# Patient Record
Sex: Male | Born: 1970 | Race: Black or African American | Hispanic: No | Marital: Single | Smoking: Current every day smoker
Health system: Southern US, Community
[De-identification: ages and names within clinical notes are randomized; demographics above are authoritative.]

## PROBLEM LIST (undated history)

## (undated) DIAGNOSIS — F1510 Other stimulant abuse, uncomplicated: Secondary | ICD-10-CM

## (undated) DIAGNOSIS — Z720 Tobacco use: Secondary | ICD-10-CM

## (undated) DIAGNOSIS — F1990 Other psychoactive substance use, unspecified, uncomplicated: Secondary | ICD-10-CM

## (undated) DIAGNOSIS — E119 Type 2 diabetes mellitus without complications: Secondary | ICD-10-CM

---

## 2010-07-24 ENCOUNTER — Emergency Department (HOSPITAL_COMMUNITY): Admission: EM | Admit: 2010-07-24 | Discharge: 2010-07-24 | Payer: Self-pay | Admitting: Emergency Medicine

## 2010-09-10 ENCOUNTER — Emergency Department (HOSPITAL_BASED_OUTPATIENT_CLINIC_OR_DEPARTMENT_OTHER)
Admission: EM | Admit: 2010-09-10 | Discharge: 2010-09-10 | Payer: Self-pay | Source: Home / Self Care | Admitting: Emergency Medicine

## 2010-11-03 ENCOUNTER — Emergency Department (HOSPITAL_BASED_OUTPATIENT_CLINIC_OR_DEPARTMENT_OTHER)
Admission: EM | Admit: 2010-11-03 | Discharge: 2010-11-03 | Disposition: A | Discharge status: Home / Self Care | Payer: Medicaid Other | Attending: Emergency Medicine | Admitting: Emergency Medicine

## 2010-11-03 DIAGNOSIS — E1169 Type 2 diabetes mellitus with other specified complication: Secondary | ICD-10-CM | POA: Insufficient documentation

## 2010-11-03 DIAGNOSIS — R197 Diarrhea, unspecified: Secondary | ICD-10-CM | POA: Insufficient documentation

## 2010-11-03 LAB — URINE MICROSCOPIC-ADD ON

## 2010-11-03 LAB — URINALYSIS, ROUTINE W REFLEX MICROSCOPIC
Ketones, ur: NEGATIVE mg/dL
Leukocytes, UA: NEGATIVE
Nitrite: NEGATIVE
Specific Gravity, Urine: 1.031 — ABNORMAL HIGH (ref 1.005–1.030)
Urine Glucose, Fasting: >1000 mg/dL — AB
pH: 7 (ref 5.0–8.0)

## 2010-11-03 LAB — GLUCOSE, CAPILLARY
Glucose-Capillary: 357 mg/dL — ABNORMAL HIGH (ref 70–99)
Glucose-Capillary: 321 mg/dL — ABNORMAL HIGH (ref 70–99)
Glucose-Capillary: 265 mg/dL — ABNORMAL HIGH (ref 70–99)

## 2010-11-03 LAB — BASIC METABOLIC PANEL
BUN: 15 mg/dL (ref 6–23)
Chloride: 101 mEq/L (ref 96–112)
GFR calc non Af Amer: >60 mL/min (ref >60)
Potassium: 4.5 mEq/L (ref 3.5–5.1)
Sodium: 141 mEq/L (ref 135–145)

## 2010-11-06 ENCOUNTER — Encounter (INDEPENDENT_AMBULATORY_CARE_PROVIDER_SITE_OTHER): Payer: Self-pay | Admitting: *Deleted

## 2010-11-14 NOTE — Letter (Signed)
Summary: New Patient letter  [facility]  [address] [place], [place] [address]   Phone: [phone]  Fax: [phone]       11/06/2010 MRN: [id number]  [place] [address] [place], [place]  [address]  Dear Mr. PAUTSCH,  Welcome to the Gastroenterology Division at [facility].    You are scheduled to see Dr.  Leone Payor on 12-15-10 at 2:30P.M. on the 3rd floor at [facility], [address]  We ask that you try to arrive at our office 15 minutes prior to your appointment time to allow for check-in.  We would like you to complete the enclosed self-administered evaluation form prior to your visit and bring it with you on the day of your appointment.  We will review it with you.  Also, please bring a complete list of all your medications or, if you prefer, bring the medication bottles and we will list them.  Please bring your insurance card so that we may make a copy of it.  If your insurance requires a referral to see a specialist, please bring your referral form from your primary care physician.  Co-payments are due at the time of your visit and may be paid by cash, check or credit card.     Your office visit will consist of a consult with your physician (includes a physical exam), any laboratory testing he/she may order, scheduling of any necessary diagnostic testing (e.g. x-ray, ultrasound, CT-scan), and scheduling of a procedure (e.g. Endoscopy, Colonoscopy) if required.  Please allow enough time on your schedule to allow for any/all of these possibilities.    If you cannot keep your appointment, please call [phone] to cancel or reschedule prior to your appointment date.  This allows [place] the opportunity to schedule an appointment for another patient in need of care.  If you do not cancel or reschedule by 5 p.m. the business day prior to your appointment date, you will be charged a $50.00 late cancellation/no-show fee.    Thank you for choosing  [facility] for your medical needs.  We appreciate the opportunity to care for you.  Please visit [place] at our website  to learn more about our practice.                     Sincerely,                                                             The Gastroenterology Division

## 2010-11-27 LAB — COMPREHENSIVE METABOLIC PANEL
AST: 205 U/L — ABNORMAL HIGH (ref 0–37)
Albumin: 4.7 g/dL (ref 3.5–5.2)
BUN: 16 mg/dL (ref 6–23)
Calcium: 9.4 mg/dL (ref 8.4–10.5)
Chloride: 102 mEq/L (ref 96–112)
Creatinine, Ser: 0.8 mg/dL (ref 0.4–1.5)
GFR calc Af Amer: >60 mL/min (ref >60)
Total Bilirubin: 0.9 mg/dL (ref 0.3–1.2)
Total Protein: 7.9 g/dL (ref 6.0–8.3)

## 2010-11-27 LAB — GLUCOSE, CAPILLARY: Glucose-Capillary: 274 mg/dL — ABNORMAL HIGH (ref 70–99)

## 2010-11-27 LAB — DIFFERENTIAL
Basophils Absolute: 0 10*3/uL (ref 0.0–0.1)
Lymphocytes Relative: 26 % (ref 12–46)
Lymphs Abs: 2.3 10*3/uL (ref 0.7–4.0)
Monocytes Absolute: 0.8 10*3/uL (ref 0.1–1.0)
Neutro Abs: 5.6 10*3/uL (ref 1.7–7.7)

## 2010-11-27 LAB — CBC
MCH: 27.8 pg (ref 26.0–34.0)
MCHC: 34.5 g/dL (ref 30.0–36.0)
MCV: 80.5 fL (ref 78.0–100.0)
Platelets: 199 10*3/uL (ref 150–400)
RDW: 13.5 % (ref 11.5–15.5)

## 2010-11-28 LAB — HEMOGLOBIN A1C: Hgb A1c MFr Bld: 13.1 % — ABNORMAL HIGH (ref <5.7)

## 2010-11-28 LAB — AST: AST: 20 U/L (ref 0–37)

## 2010-11-28 LAB — ALT: ALT: 24 U/L (ref 0–53)

## 2010-12-15 ENCOUNTER — Ambulatory Visit: Payer: Medicaid Other | Admitting: Internal Medicine

## 2012-11-18 ENCOUNTER — Encounter (HOSPITAL_COMMUNITY): Payer: Self-pay | Admitting: *Deleted

## 2012-11-18 ENCOUNTER — Emergency Department (HOSPITAL_COMMUNITY)
Admission: EM | Admit: 2012-11-18 | Discharge: 2012-11-18 | Disposition: A | Discharge status: Home / Self Care | Payer: Medicaid Other | Source: Home / Self Care | Attending: Emergency Medicine | Admitting: Emergency Medicine

## 2012-11-18 DIAGNOSIS — E119 Type 2 diabetes mellitus without complications: Secondary | ICD-10-CM

## 2012-11-18 HISTORY — DX: Type 2 diabetes mellitus without complications: E11.9

## 2012-11-18 MED ORDER — FREESTYLE SYSTEM KIT: 1.0000 | PACK | Freq: Three times a day (TID) | Status: DC

## 2012-11-18 MED ORDER — METFORMIN HCL 1000 MG PO TABS: 1000.0000 mg | ORAL_TABLET | Freq: Two times a day (BID) | ORAL | Status: DC

## 2012-11-18 NOTE — ED Provider Notes (Signed)
History     CSN: [id number]  Arrival date & time 11/18/12  1042   First MD Initiated Contact with Patient 11/18/12 1126      Chief Complaint  Patient presents with  . Diabetes  . Medication Refill    (Consider location/radiation/quality/duration/timing/severity/associated sxs/prior treatment) HPI Comments: Pt recently released from jail.   Pt out of diabetes medication.  Pt also needs a glucometer.  Pt denies any complaints.  No current MD.   Past Medical History  Diagnosis Date  . Diabetes mellitus without complication     History reviewed. No pertinent past surgical history.  Family History  Problem Relation Age of Onset  . Diabetes Other     History  Substance Use Topics  . Smoking status: Never Smoker   . Smokeless tobacco: Not on file  . Alcohol Use: No      Review of Systems  All other systems reviewed and are negative.    Allergies  Review of patient's allergies indicates no known allergies.  Home Medications  No current outpatient prescriptions on file.  BP 111/67  Pulse 84  Temp(Src) 98.4 F (36.9 C) (Oral)  Resp 16  SpO2 99%  Physical Exam  Nursing note and vitals reviewed. Constitutional: He appears well-developed and well-nourished.  HENT:  Head: Normocephalic and atraumatic.  Eyes: Pupils are equal, round, and reactive to light.  Neck: Normal range of motion.  Cardiovascular: Normal rate and regular rhythm.   Pulmonary/Chest: Effort normal and breath sounds normal.  Abdominal: Soft.  Musculoskeletal: Normal range of motion.  Neurological: He is alert.  Skin: Skin is warm.    ED Course  Procedures (including critical care time)  Labs Reviewed - No data to display No results found.   1. Diabetes mellitus       MDM  Metformin 1000 bid        Lonia Skinner [place], [place] 11/18/12 1141  Lonia Skinner [place], [place] 11/18/12 1143

## 2012-11-18 NOTE — ED Provider Notes (Signed)
Medical screening examination/treatment/procedure(s) were performed by non-physician practitioner and as supervising physician I was immediately available for consultation/collaboration.  David Keller, M.D.  David C Keller, MD 11/18/12 2138 

## 2012-11-18 NOTE — ED Notes (Signed)
Pt reports needing med refill for diabetic meds and glucometer  - recently acquired Medicaid after being incarcerated  - unable to test glucose , no meter - also [facility]

## 2012-12-05 ENCOUNTER — Emergency Department (HOSPITAL_COMMUNITY)
Admission: EM | Admit: 2012-12-05 | Discharge: 2012-12-05 | Disposition: A | Discharge status: Home / Self Care | Payer: Medicaid Other | Source: Home / Self Care

## 2012-12-29 ENCOUNTER — Encounter (HOSPITAL_COMMUNITY): Payer: Self-pay | Admitting: Emergency Medicine

## 2012-12-29 ENCOUNTER — Emergency Department (HOSPITAL_COMMUNITY)
Admission: EM | Admit: 2012-12-29 | Discharge: 2012-12-29 | Disposition: A | Discharge status: Home / Self Care | Payer: Medicaid Other | Source: Home / Self Care | Attending: Emergency Medicine | Admitting: Emergency Medicine

## 2012-12-29 DIAGNOSIS — J020 Streptococcal pharyngitis: Secondary | ICD-10-CM

## 2012-12-29 LAB — POCT RAPID STREP A: Streptococcus, Group A Screen (Direct): POSITIVE — AB

## 2012-12-29 MED ORDER — ACETAMINOPHEN-CODEINE #3 300-30 MG PO TABS: 1.0000 | ORAL_TABLET | Freq: Four times a day (QID) | ORAL | Status: DC | PRN

## 2012-12-29 MED ORDER — AZITHROMYCIN 250 MG PO TABS: 250.0000 mg | ORAL_TABLET | Freq: Every day | ORAL | Status: DC

## 2012-12-29 NOTE — ED Notes (Signed)
Reports waking today with sore throat, cough, sore in chest, headache, and generally feeling bad

## 2012-12-29 NOTE — ED Provider Notes (Signed)
History     CSN: [id number]  Arrival date & time 12/29/12  1515   First MD Initiated Contact with Patient 12/29/12 1519      Chief Complaint  Patient presents with  . Sore Throat    (Consider location/radiation/quality/duration/timing/severity/associated sxs/prior treatment) HPI Comments: Patient presents urgent care this evening complaining that for several days been feeling somewhat tired having a sore throat cough headache and generally feeling malaise. Has had 3 family members and also with similar symptoms last week he also described that couple days ago he had 2 episodes of liquidy diarrhea. Patient denies any abdominal pain, shortness of breath nausea or vomiting but does feel discomfort when he swallows.  Patient is a 42 y.o. male presenting with pharyngitis. The history is provided by the patient.  Sore Throat This is a new problem. The current episode started more than 1 week ago. The problem occurs constantly. The problem has been gradually worsening. Pertinent negatives include no abdominal pain, no headaches and no shortness of breath. The symptoms are aggravated by swallowing. Nothing relieves the symptoms. He has tried nothing for the symptoms.    Past Medical History  Diagnosis Date  . Diabetes mellitus without complication     History reviewed. No pertinent past surgical history.  Family History  Problem Relation Age of Onset  . Diabetes Other     History  Substance Use Topics  . Smoking status: Never Smoker   . Smokeless tobacco: Not on file  . Alcohol Use: No      Review of Systems  Constitutional: Positive for appetite change. Negative for fever, diaphoresis, fatigue and unexpected weight change.  HENT: Positive for congestion and sore throat. Negative for ear pain, rhinorrhea, trouble swallowing, neck pain, neck stiffness, voice change and postnasal drip.   Respiratory: Negative for cough and shortness of breath.   Cardiovascular: Negative for leg  swelling.  Gastrointestinal: Negative for abdominal pain.  Endocrine: Negative for polyuria.  Skin: Negative for rash.  Neurological: Negative for headaches.  Hematological: Negative for adenopathy.    Allergies  Penicillins  Home Medications   Current Outpatient Rx  Name  Route  Sig  Dispense  Refill  . acetaminophen-codeine (TYLENOL #3) 300-30 MG per tablet   Oral   Take 1-2 tablets by mouth every 6 (six) hours as needed for pain.   15 tablet   0   . azithromycin (ZITHROMAX) 250 MG tablet   Oral   Take 1 tablet (250 mg total) by mouth daily. Take first 2 tablets together, then 1 every day until finished.   6 tablet   0   . glucose monitoring kit (FREESTYLE) monitoring kit   Does not apply   1 each by Does not apply route 3 (three) times daily. Please dispense glucose monitor, strips and supplies.   1 each   prn   . metFORMIN (GLUCOPHAGE) 1000 MG tablet   Oral   Take 1 tablet (1,000 mg total) by mouth 2 (two) times daily.   60 tablet   3     BP 133/77  Pulse 99  Temp(Src) 100 F (37.8 C) (Oral)  Resp 16  SpO2 97%  Physical Exam  Vitals reviewed. Constitutional: Vital signs are normal. He appears well-developed and well-nourished.  Non-toxic appearance. He does not have a sickly appearance. He does not appear ill. No distress.  HENT:  Head: Normocephalic.  Right Ear: Tympanic membrane normal.  Left Ear: Tympanic membrane normal.  Mouth/Throat: Uvula is midline. Oropharyngeal exudate  and posterior oropharyngeal erythema present. No posterior oropharyngeal edema or tonsillar abscesses.  Eyes: Conjunctivae are normal. Right eye exhibits no discharge. Left eye exhibits no discharge. No scleral icterus.  Neck: No thyromegaly present.  Cardiovascular: Normal rate.  Exam reveals no gallop and no friction rub.   No murmur heard. Pulmonary/Chest: Effort normal and breath sounds normal. He has no decreased breath sounds. He has no wheezes. He has no rhonchi. He has  no rales.  Abdominal: There is no tenderness.  Musculoskeletal: He exhibits no tenderness.  Neurological: He is alert.  Skin: No rash noted. No erythema.    ED Course  Procedures (including critical care time)  Labs Reviewed  POCT RAPID STREP A ([facility] ONLY) - Abnormal; Notable for the following:    Streptococcus, Group A Screen (Direct) POSITIVE (*)    All other components within normal limits   No results found.   1. Pharyngitis, streptococcal       MDM  Uncomplicated streptococcal pharyngitis. Patient has been prescribed a macrolide course of 5 days. Given a prescription of Tylenol #3.       Jimmie Molly, MD 12/29/12 [id number]

## 2012-12-29 NOTE — ED Notes (Signed)
cbg result had not crossed over to record.  Lab reported cbg as 213.  Reported to dr Ladon Applebaum

## 2013-01-28 ENCOUNTER — Ambulatory Visit: Payer: Medicaid Other | Attending: Family Medicine | Admitting: Family Medicine

## 2013-01-28 VITALS — BP 124/75 | HR 85 | Temp 98.9°F | Ht 68.5 in | Wt 218.0 lb

## 2013-01-28 DIAGNOSIS — N529 Male erectile dysfunction, unspecified: Secondary | ICD-10-CM

## 2013-01-28 DIAGNOSIS — E1169 Type 2 diabetes mellitus with other specified complication: Secondary | ICD-10-CM

## 2013-01-28 MED ORDER — METFORMIN HCL ER 500 MG PO TB24: 1000.0000 mg | ORAL_TABLET | Freq: Two times a day (BID) | ORAL | Status: DC

## 2013-01-28 MED ORDER — TADALAFIL 10 MG PO TABS: 10.0000 mg | ORAL_TABLET | ORAL | Status: DC | PRN

## 2013-01-28 NOTE — Patient Instructions (Addendum)
Erectile Dysfunction Erectile dysfunction (ED) is the inability to get a good enough erection to have sexual intercourse. ED may involve:  Inability to get an erection.  Lack of enough hardness to allow penetration.  Loss of the erection before sex is finished.  Premature ejaculation.  Any combination of these problems if they occur more than 25% of the time. CAUSES  Certain drugs, such as:  Pain relievers.  Antihistamines.  Antidepressants.  Blood pressure medicines.  Water pills.  Ulcer medicines.  Muscle relaxants.  Illegal drugs.  Excessive drinking.  Psychological causes, such as:  Anxiety.  Depression.  Sadness.  Exhaustion.  Performance fear.  Stress.  Physical causes, such as:  Artery problems. This may include diabetes, smoking, liver disease, or atherosclerosis.  High blood pressure.  Hormonal problems, such as low testosterone.  Obesity.  Nerve problems. This may include back or pelvic injuries, diabetes, multiple sclerosis, Parkinson's disease, or some surgeries. SYMPTOMS  Inability to get an erection.  Lack of enough hardness to allow penetration.  Loss of the erection before sex is finished.  Premature ejaculation.  Normal erections at some times, but with frequent unsatisfactory episodes.  Orgasms that are not satisfactory in sensation or frequency.  Low sexual satisfaction in either partner because of erection problems.  A curved penis occurring with erection. The curve may cause pain or may be too curved to allow for intercourse.  Never having nighttime erections. DIAGNOSIS Your caregiver can often diagnose this condition by:  Performing a physical exam to find other diseases or specific problems with the penis.  Asking you detailed questions about the problem.  Performing blood tests to check for diabetes or to measure hormone levels.  Performing urine tests to find other underlying health  conditions.  Performing an ultrasound to check for scarring.  Performing a test to check blood flow to the penis.  Doing a sleep study at home to measure nighttime erections. TREATMENT   You may be prescribed medicines by mouth.  You may be given medicine injections into the penis.  You may be prescribed a vacuum pump with a ring.  Penile implant surgery may be performed. You may receive:  An inflatable implant.  A semi-rigid implant.  Blood vessel surgery may be performed. HOME CARE INSTRUCTIONS  Take all medicine as directed by your caregiver. Do not take any other medicines without talking to your caregiver first.  Follow your caregiver's directions for specific treatments as prescribed.  Follow up with your caregiver as directed. Document Released: 08/31/2000 Document Revised: 11/26/2011 Document Reviewed: 12/24/2010 [facility] Patient Information 2013 [place], [place]. 1800 Calorie Diet for Diabetes Meal Planning The 1800 calorie diet is designed for eating up to 1800 calories each day. Following this diet and making healthy meal choices can help improve overall health. This diet controls blood sugar (glucose) levels and can also help lower blood pressure and cholesterol. SERVING SIZES Measuring foods and serving sizes helps to make sure you are getting the right amount of food. The list below tells how big or small some common serving sizes are:  1 oz.........4 stacked dice.  3 oz........Marland KitchenDeck of cards.  1 tsp.......Marland KitchenTip of little finger.  1 tbs......Marland KitchenMarland KitchenThumb.  2 tbs.......Marland KitchenGolf ball.   cup......Marland KitchenHalf of a fist.  1 cup.......Marland KitchenA fist. GUIDELINES FOR CHOOSING FOODS The goal of this diet is to eat a variety of foods and limit calories to 1800 each day. This can be done by choosing foods that are low in calories and fat. The diet also suggests  eating small amounts of food frequently. Doing this helps control your blood glucose levels so they do not get too high or too  low. Each meal or snack may include a protein food source to help you feel more satisfied and to stabilize your blood glucose. Try to eat about the same amount of food around the same time each day. This includes weekend days, travel days, and days off work. Space your meals about 4 to 5 hours apart and add a snack between them if you wish.  For example, a daily food plan could include breakfast, a morning snack, lunch, dinner, and an evening snack. Healthy meals and snacks include whole grains, vegetables, fruits, lean meats, poultry, fish, and dairy products. As you plan your meals, select a variety of foods. Choose from the bread and starch, vegetable, fruit, dairy, and meat/protein groups. Examples of foods from each group and their suggested serving sizes are listed below. Use measuring cups and spoons to become familiar with what a healthy portion looks like. Bread and Starch Each serving equals 15 grams of carbohydrates.  1 slice bread.   bagel.   cup cold cereal (unsweetened).   cup hot cereal or mashed potatoes.  1 small potato (size of a computer mouse).   cup cooked pasta or rice.   English muffin.  1 cup broth-based soup.  3 cups of popcorn.  4 to 6 whole-wheat crackers.   cup cooked beans, peas, or corn. Vegetable Each serving equals 5 grams of carbohydrates.   cup cooked vegetables.  1 cup raw vegetables.   cup tomato or vegetable juice. Fruit Each serving equals 15 grams of carbohydrates.  1 small apple or orange.  1 cup watermelon or strawberries.   cup applesauce (no sugar added).  2 tbs raisins.   banana.   cup canned fruit, packed in water, its own juice, or sweetened with a sugar substitute.   cup unsweetened fruit juice. Dairy Each serving equals 12 to 15 grams of carbohydrates.  1 cup fat-free milk.  6 oz artificially sweetened yogurt or plain yogurt.  1 cup low-fat buttermilk.  1 cup soy milk.  1 cup almond  milk. Meat/Protein  1 large egg.  2 to 3 oz meat, poultry, or fish.   cup low-fat cottage cheese.  1 tbs peanut butter.  1 oz low-fat cheese.   cup tuna in water.   cup tofu. Fat  1 tsp oil.  1 tsp trans-fat-free margarine.  1 tsp butter.  1 tsp mayonnaise.  2 tbs avocado.  1 tbs salad dressing.  1 tbs cream cheese.  2 tbs sour cream. SAMPLE 1800 CALORIE DIET PLAN Breakfast   cup unsweetened cereal (1 carb serving).  1 cup fat-free milk (1 carb serving).  1 slice whole-wheat toast (1 carb serving).   small banana (1 carb serving).  1 scrambled egg.  1 tsp trans-fat-free margarine. Lunch  Tuna sandwich.  2 slices whole-wheat bread (2 carb servings).   cup canned tuna in water, drained.  1 tbs reduced fat mayonnaise.  1 stalk celery, chopped.  2 slices tomato.  1 lettuce leaf.  1 cup carrot sticks.  24 to 30 seedless grapes (2 carb servings).  6 oz light yogurt (1 carb serving). Afternoon Snack  3 graham cracker squares (1 carb serving).  Fat-free milk, 1 cup (1 carb serving).  1 tbs peanut butter. Dinner  3 oz salmon, broiled with 1 tsp oil.  1 cup mashed potatoes (2 carb servings) with 1  tsp trans-fat-free margarine.  1 cup fresh or frozen green beans.  1 cup steamed asparagus.  1 cup fat-free milk (1 carb serving). Evening Snack  3 cups air-popped popcorn (1 carb serving).  2 tbs parmesan cheese sprinkled on top. MEAL PLAN Use this worksheet to help you make a daily meal plan based on the 1800 calorie diet suggestions. If you are using this plan to help you control your blood glucose, you may interchange carbohydrate-containing foods (dairy, starches, and fruits). Select a variety of fresh foods of varying colors and flavors. The total amount of carbohydrate in your meals or snacks is more important than making sure you include all of the food groups every time you eat. Choose from the following foods to build your  day's meals:  8 Starches.  4 Vegetables.  3 Fruits.  2 Dairy.  6 to 7 oz Meat/Protein.  Up to 4 Fats. Your dietician can use this worksheet to help you decide how many servings and which types of foods are right for you. BREAKFAST Food Group and Servings / Food Choice Starch ________________________________________________________ Dairy _________________________________________________________ Fruit _________________________________________________________ Meat/Protein __________________________________________________ Fat ___________________________________________________________ LUNCH Food Group and Servings / Food Choice Starch ________________________________________________________ Meat/Protein __________________________________________________ Vegetable _____________________________________________________ Fruit _________________________________________________________ Dairy _________________________________________________________ Fat ___________________________________________________________ Aura Fey Food Group and Servings / Food Choice Starch ________________________________________________________ Meat/Protein __________________________________________________ Fruit __________________________________________________________ Dairy _________________________________________________________ Laural Golden Food Group and Servings / Food Choice Starch _________________________________________________________ Meat/Protein ___________________________________________________ Dairy __________________________________________________________ Vegetable ______________________________________________________ Fruit ___________________________________________________________ Fat ____________________________________________________________ Lollie Sails Food Group and Servings / Food Choice Fruit __________________________________________________________ Meat/Protein  ___________________________________________________ Dairy __________________________________________________________ Starch _________________________________________________________ DAILY TOTALS Starch ____________________________ Vegetable _________________________ Fruit _____________________________ Dairy _____________________________ Meat/Protein______________________ Fat _______________________________ Document Released: 03/26/2005 Document Revised: 11/26/2011 Document Reviewed: 07/20/2011 [employer] Patient Information 2013 [place], [place]. A1c, Hemoglobin A1c The A1c (hemoglobin A1c, glycosylated hemoglobin) test checks the average amount of sugar (glucose) in the blood over the last 2 to 3 months. It does this by measuring the concentration of glycosylated hemoglobin. As glucose circulates in the blood, some of it binds to hemoglobin A. This is the main form of hemoglobin in adults. Hemoglobin is a red protein that carries oxygen in the red blood cells (RBCs). Once the glucose is bound to the hemoglobin A, it remains there for the life of the red blood cell (about 120 days). This combination of glucose and hemoglobin A is called A1c. Increased glucose in the blood, increases the hemoglobin A1c. A1c levels do not change quickly but will shift as RBCs are replaced. A1c is a valuable test because it enables you to know how your glucose has been controlled over the past 3 months.  5% A1c  Estimated Average Glucose mg/dL: 97  6% Z6X  Estimated Average Glucose mg/dL: 096  7% E4V  Estimated Average Glucose mg/dL: 409  8% W1X  Estimated Average Glucose mg/dL: 914  9% N8G  Estimated Average Glucose mg/dL: 956  21% H0Q  Estimated Average Glucose mg/dL: 657  84% O9G  Estimated Average Glucose mg/dL: 295  28% U1L  Estimated Average Glucose mg/dL: 244 The American Diabetes Association (ADA) recommends testing your A1c level 4 times each year if you have type 1 or type 2  diabetes and use insulin; or 2 times each year if you have type 2 diabetes and do not use insulin. When someone is first diagnosed with diabetes or if control is not good, A1c may be ordered more frequently. PREPARATION FOR TEST No preparation or fasting is necessary for this blood sample. NORMAL FINDINGS  Adults without diabetes: 2.2 to 4.8%  Children without diabetes: 1.8 to 4.0%  Good diabetic control: 2.5 to 5.9%  Fair diabetic control: 6 to 8%  Poor diabetic control: greater than 8% The values of A1c may be falsely low in pregnancy, in disorders with shortened red blood cell lives, or in sickle cell disease or trait (carrier). The values may be falsely high in disorders with longer red cell lives, or in people with Thallassemia, kidney failure, or iron deficiency anemia. Ranges for normal findings may vary among different laboratories and hospitals. You should always check with your doctor after having lab work or other tests done to discuss the meaning of your test results and whether your values are considered within normal limits. MEANING OF TEST  Your caregiver will go over the test results with you and discuss the importance and meaning of your results, as well as treatment options and the need for additional tests if necessary. If your A1c is greater than 7%, discuss treatment options with your caregiver. OBTAINING THE TEST RESULTS  It is your responsibility to obtain your test results. Ask the lab or department performing the test when and how you will get your results. Document Released: 09/25/2004 Document Revised: 11/26/2011 Document Reviewed: 08/07/2008 [facility] Patient Information 2013 [place], [place]. Hypoglycemia (Low Blood Sugar) Hypoglycemia is when the glucose (sugar) in your blood is too low. Hypoglycemia can happen for many reasons. It can happen to people with or without diabetes. Hypoglycemia can develop quickly and can be a medical emergency.  CAUSES  Having  hypoglycemia does not mean that you will develop diabetes. Different causes include:  Missed or delayed meals or not enough carbohydrates eaten.  Medication overdose. This could be by accident or deliberate. If by accident, your medication may need to be adjusted or changed.  Exercise or increased activity without adjustments in carbohydrates or medications.  A nerve disorder that affects body functions like your heart rate, blood pressure and digestion (autonomic neuropathy).  A condition where the stomach muscles do not function properly (gastroparesis). Therefore, medications may not absorb properly.  The inability to recognize the signs of hypoglycemia (hypoglycemic unawareness).  Absorption of insulin  may be altered.  Alcohol consumption.  Pregnancy/menstrual cycles/postpartum. This may be due to hormones.  Certain kinds of tumors. This is very rare. SYMPTOMS   Sweating.  Hunger.  Dizziness.  Blurred vision.  Drowsiness.  Weakness.  Headache.  Rapid heart beat.  Shakiness.  Nervousness. DIAGNOSIS  Diagnosis is made by monitoring blood glucose in one or all of the following ways:  Fingerstick blood glucose monitoring.  Laboratory results. TREATMENT  If you think your blood glucose is low:  Check your blood glucose, if possible. If it is less than 70 mg/dl, take one of the following:  3-4 glucose tablets.   cup juice (prefer clear like apple).   cup "regular" soda pop.  1 cup milk.  -1 tube of glucose gel.  5-6 hard candies.  Do not over treat because your blood glucose (sugar) will only go too high.  Wait 15 minutes and recheck your blood glucose. If it is still less than 70 mg/dl (or below your target range), repeat treatment.  Eat a snack if it is more than one hour until your next meal. Sometimes, your blood glucose may go so low that you are unable to treat yourself. You may need someone to help you. You may even pass out or be unable  to swallow. This may require you  to get an injection of glucagon, which raises the blood glucose. HOME CARE INSTRUCTIONS  Check blood glucose as recommended by your caregiver.  Take medication as prescribed by your caregiver.  Follow your meal plan. Do not skip meals. Eat on time.  If you are going to drink alcohol, drink it only with meals.  Check your blood glucose before driving.  Check your blood glucose before and after exercise. If you exercise longer or different than usual, be sure to check blood glucose more frequently.  Always carry treatment with you. Glucose tablets are the easiest to carry.  Always wear medical alert jewelry or carry some form of identification that states that you have diabetes. This will alert people that you have diabetes. If you have hypoglycemia, they will have a better idea on what to do. SEEK MEDICAL CARE IF:   You are having problems keeping your blood sugar at target range.  You are having frequent episodes of hypoglycemia.  You feel you might be having side effects from your medicines.  You have symptoms of an illness that is not improving after 3-4 days.  You notice a change in vision or a new problem with your vision. SEEK IMMEDIATE MEDICAL CARE IF:   You are a family member or friend of a person whose blood glucose goes below 70 mg/dl and is accompanied by:  Confusion.  A change in mental status.  The inability to swallow.  Passing out. Document Released: 09/03/2005 Document Revised: 11/26/2011 Document Reviewed: 12/31/2011 [facility] Patient Information 2013 [place], [place]. Diabetes and Exercise Regular exercise is important and can help:   Control blood glucose (sugar).  Decrease blood pressure.    Control blood lipids (cholesterol, triglycerides).  Improve overall health. BENEFITS FROM EXERCISE  Improved fitness.  Improved flexibility.  Improved endurance.  Increased bone density.  Weight  control.  Increased muscle strength.  Decreased body fat.  Improvement of the body's use of insulin, a hormone.  Increased insulin sensitivity.  Reduction of insulin needs.  Reduced stress and tension.  Helps you feel better. People with diabetes who add exercise to their lifestyle gain additional benefits, including:  Weight loss.  Reduced appetite.  Improvement of the body's use of blood glucose.  Decreased risk factors for heart disease:  Lowering of cholesterol and triglycerides.  Raising the level of good cholesterol (high-density lipoproteins, HDL).  Lowering blood sugar.  Decreased blood pressure. TYPE 1 DIABETES AND EXERCISE  Exercise will usually lower your blood glucose.  If blood glucose is greater than 240 mg/dl, check urine ketones. If ketones are present, do not exercise.  Location of the insulin injection sites may need to be adjusted with exercise. Avoid injecting insulin into areas of the body that will be exercised. For example, avoid injecting insulin into:  The arms when playing tennis.  The legs when jogging. For more information, discuss this with your caregiver.  Keep a record of:  Food intake.  Type and amount of exercise.  Expected peak times of insulin action.  Blood glucose levels. Do this before, during, and after exercise. Review your records with your caregiver. This will help you to develop guidelines for adjusting food intake and insulin amounts.  TYPE 2 DIABETES AND EXERCISE  Regular physical activity can help control blood glucose.  Exercise is important because it may:  Increase the body's sensitivity to insulin.  Improve blood glucose control.  Exercise reduces the risk of heart disease. It decreases serum cholesterol and triglycerides. It also lowers blood  pressure.  Those who take insulin or oral hypoglycemic agents should watch for signs of hypoglycemia. These signs include dizziness, shaking, sweating, chills,  and confusion.  Body water is lost during exercise. It must be replaced. This will help to avoid loss of body fluids (dehydration) or heat stroke. Be sure to talk to your caregiver before starting an exercise program to make sure it is safe for you. Remember, any activity is better than none.  Document Released: 11/24/2003 Document Revised: 11/26/2011 Document Reviewed: 03/10/2009 [facility] Patient Information 2013 [place], [place].

## 2013-01-29 LAB — HEMOGLOBIN A1C: Mean Plasma Glucose: 180 mg/dL — ABNORMAL HIGH (ref <117)

## 2013-01-29 LAB — COMPREHENSIVE METABOLIC PANEL
Albumin: 4.9 g/dL (ref 3.5–5.2)
BUN: 13 mg/dL (ref 6–23)
Calcium: 9.5 mg/dL (ref 8.4–10.5)
Chloride: 106 mEq/L (ref 96–112)
Creat: 0.86 mg/dL (ref 0.50–1.35)
Glucose, Bld: 123 mg/dL — ABNORMAL HIGH (ref 70–99)
Potassium: 4.1 mEq/L (ref 3.5–5.3)

## 2013-01-29 LAB — LIPID PANEL: Cholesterol: 116 mg/dL (ref 0–200)

## 2013-01-29 NOTE — Progress Notes (Signed)
Patient ID: Dan [name], male   DOB: 1971-09-13, 42 y.o.   MRN: [id number] CC: diabetes check up   HPI: Pt says that he has been working out more frequently.  He needs refills of his meds.  He says that he has no low BS.  Pt says he wants a prescription for cialis.  He has taken it in the past.  He has no other complaints.   Allergies  Allergen Reactions  . Penicillins    Past Medical History  Diagnosis Date  . Diabetes mellitus without complication    Current Outpatient Prescriptions on File Prior to Visit  Medication Sig Dispense Refill  . acetaminophen-codeine (TYLENOL #3) 300-30 MG per tablet Take 1-2 tablets by mouth every 6 (six) hours as needed for pain.  15 tablet  0  . azithromycin (ZITHROMAX) 250 MG tablet Take 1 tablet (250 mg total) by mouth daily. Take first 2 tablets together, then 1 every day until finished.  6 tablet  0  . glucose monitoring kit (FREESTYLE) monitoring kit 1 each by Does not apply route 3 (three) times daily. Please dispense glucose monitor, strips and supplies.  1 each  prn   No current facility-administered medications on file prior to visit.   Family History  Problem Relation Age of Onset  . Diabetes Other   . Diabetes Maternal Grandfather    History   Social History  . Marital Status: Single    Spouse Name: N/A    Number of Children: N/A  . Years of Education: N/A   Occupational History  . Not on file.   Social History Main Topics  . Smoking status: Former Smoker    Quit date: 07/18/2012  . Smokeless tobacco: Not on file  . Alcohol Use: No  . Drug Use: No  . Sexually Active: Not on file   Other Topics Concern  . Not on file   Social History Narrative  . No narrative on file    Review of Systems  Constitutional: Negative for fever, chills, diaphoresis, activity change, appetite change and fatigue.  HENT: Negative for ear pain, nosebleeds, congestion, facial swelling, rhinorrhea, neck pain, neck stiffness and ear discharge.    Eyes: Negative for pain, discharge, redness, itching and visual disturbance.  Respiratory: Negative for cough, choking, chest tightness, shortness of breath, wheezing and stridor.   Cardiovascular: Negative for chest pain, palpitations and leg swelling.  Gastrointestinal: Negative for abdominal distention.  Genitourinary: Negative for dysuria, urgency, frequency, hematuria, flank pain, decreased urine volume, difficulty urinating and dyspareunia.  Musculoskeletal: Negative for back pain, joint swelling, arthralgias and gait problem.  Neurological: Negative for dizziness, tremors, seizures, syncope, facial asymmetry, speech difficulty, weakness, light-headedness, numbness and headaches.  Hematological: Negative for adenopathy. Does not bruise/bleed easily.  Psychiatric/Behavioral: Negative for hallucinations, behavioral problems, confusion, dysphoric mood, decreased concentration and agitation.    Objective:   Filed Vitals:   01/28/13 1847  BP: 124/75  Pulse: 85  Temp: 98.9 F (37.2 C)    Physical Exam  Constitutional: Appears well-developed and well-nourished. No distress.  HENT: Normocephalic. External right and left ear normal. Oropharynx is clear and moist.  Eyes: Conjunctivae and EOM are normal. PERRLA, no scleral icterus.  Neck: Normal ROM. Neck supple. No JVD. No tracheal deviation. No thyromegaly.  CVS: RRR, S1/S2 +, no murmurs, no gallops, no carotid bruit.  Pulmonary: Effort and breath sounds normal, no stridor, rhonchi, wheezes, rales.  Abdominal: Soft. BS +,  no distension, tenderness, rebound or guarding.  Musculoskeletal:  Normal range of motion. No edema and no tenderness.  Lymphadenopathy: No lymphadenopathy noted, cervical, inguinal. Neuro: Alert. Normal reflexes, muscle tone coordination. No cranial nerve deficit. Skin: Skin is warm and dry. No rash noted. Not diaphoretic. No erythema. No pallor.  Psychiatric: Normal mood and affect. Behavior, judgment, thought  content normal.   Lab Results  Component Value Date   WBC 8.7 09/10/2010   HGB 14.4 09/10/2010   HCT 41.7 09/10/2010   MCV 80.5 09/10/2010   PLT 199 09/10/2010   Lab Results  Component Value Date   CREATININE 0.86 01/28/2013   BUN 13 01/28/2013   NA 141 01/28/2013   K 4.1 01/28/2013   CL 106 01/28/2013   CO2 23 01/28/2013    Lab Results  Component Value Date   HGBA1C 7.9* 01/28/2013        Assessment:  Type 2 diabetes mellitus, uncontrolled Erectile Dysfunction        Plan:     Type II or unspecified type diabetes mellitus without mention of complication, uncontrolled - Plan: Comprehensive metabolic panel, Hemoglobin A1c, Lipid Panel, TSH, refilled metformin, Glucophage XR 1000 bid AC  Erectile dysfunction associated with type 2 diabetes mellitus Rx for cialis 10 mg prn, pt has used in the past   The patient was given clear instructions to go to ER or return to medical center if symptoms don't improve, worsen or new problems develop.  The patient verbalized understanding.  The patient was told to call to get lab results if they haven't heard anything in the next week.    Rodney Langton, MD, CDE, FAAFP [facility] [place], [place]

## 2013-01-29 NOTE — Progress Notes (Signed)
Quick Note:  Please inform patient that his labs show that his A1c is improving to 7.9% from 13%. Continue to exercise, watch diet and take medications. Other labs OK. Recheck labs in 3-4 months.   Rodney Langton, MD, CDE, FAAFP [facility] [place], [place]   ______

## 2013-01-30 ENCOUNTER — Telehealth: Payer: Self-pay

## 2013-01-30 NOTE — Telephone Encounter (Signed)
Patient returned call Lab results given 

## 2013-01-30 NOTE — Telephone Encounter (Signed)
Patient not available at either number Message left to return our call

## 2013-02-05 ENCOUNTER — Telehealth: Payer: Self-pay | Admitting: General Practice

## 2013-02-05 NOTE — Telephone Encounter (Signed)
02/05/13 Patient not available message left to tell patient that we returned the call. P.Sruti Ayllon,RN

## 2013-02-05 NOTE — Telephone Encounter (Signed)
Would like to know if there is a generic option for erectile dysfunction, Cialis is expensive.

## 2013-03-23 ENCOUNTER — Encounter (HOSPITAL_COMMUNITY): Payer: Self-pay | Admitting: Emergency Medicine

## 2013-03-23 ENCOUNTER — Emergency Department (HOSPITAL_COMMUNITY): Payer: Medicaid Other

## 2013-03-23 ENCOUNTER — Emergency Department (HOSPITAL_COMMUNITY)
Admission: EM | Admit: 2013-03-23 | Discharge: 2013-03-23 | Disposition: A | Discharge status: Home / Self Care | Payer: Medicaid Other | Attending: Emergency Medicine | Admitting: Emergency Medicine

## 2013-03-23 DIAGNOSIS — F17200 Nicotine dependence, unspecified, uncomplicated: Secondary | ICD-10-CM | POA: Insufficient documentation

## 2013-03-23 DIAGNOSIS — Z880 Allergy status to penicillin: Secondary | ICD-10-CM | POA: Insufficient documentation

## 2013-03-23 DIAGNOSIS — R0789 Other chest pain: Secondary | ICD-10-CM

## 2013-03-23 DIAGNOSIS — R071 Chest pain on breathing: Secondary | ICD-10-CM | POA: Insufficient documentation

## 2013-03-23 DIAGNOSIS — Z79899 Other long term (current) drug therapy: Secondary | ICD-10-CM | POA: Insufficient documentation

## 2013-03-23 DIAGNOSIS — E119 Type 2 diabetes mellitus without complications: Secondary | ICD-10-CM | POA: Insufficient documentation

## 2013-03-23 LAB — CBC
Hemoglobin: 14.1 g/dL (ref 13.0–17.0)
Platelets: 199 10*3/uL (ref 150–400)
RBC: 5.06 MIL/uL (ref 4.22–5.81)

## 2013-03-23 LAB — BASIC METABOLIC PANEL
CO2: 26 mEq/L (ref 19–32)
Calcium: 9.4 mg/dL (ref 8.4–10.5)
GFR calc non Af Amer: >90 mL/min (ref >90)
Glucose, Bld: 286 mg/dL — ABNORMAL HIGH (ref 70–99)
Potassium: 4.4 mEq/L (ref 3.5–5.1)
Sodium: 138 mEq/L (ref 135–145)

## 2013-03-23 LAB — POCT I-STAT TROPONIN I: Troponin i, poc: 0 ng/mL (ref 0.00–0.08)

## 2013-03-23 MED ORDER — METHOCARBAMOL 750 MG PO TABS: 750.0000 mg | ORAL_TABLET | Freq: Four times a day (QID) | ORAL | Status: DC

## 2013-03-23 NOTE — ED Notes (Signed)
Dr. Allen at bedside for evaluation

## 2013-03-23 NOTE — ED Notes (Signed)
Phlebotomy notified of need for workman's comp UDS

## 2013-03-23 NOTE — ED Notes (Signed)
Per EMS, patient works in [employer].  Patient was working and started having L chest pain.   Patient had shortness of breath and dizziness while having the chest pain.   Patient states he's always diaphoretic when working so he couldn't tell if he was additionally diaphoretic during pain.   Patient states he resolved by the time EMS got to his location and didn't want to come in.   Per EMS, patient refused IV line on the way in.  He advised EMS that he didn't need, since pain had resolved.   Patient states he feels fine now.   VSS.  CBG 265.

## 2013-03-23 NOTE — ED Provider Notes (Signed)
History    CSN: [id number] Arrival date & time 03/23/13  1020  First MD Initiated Contact with Patient 03/23/13 1041     Chief Complaint  Patient presents with  . Chest Pain   (Consider location/radiation/quality/duration/timing/severity/associated sxs/prior Treatment) Patient is a 42 y.o. male presenting with chest pain. The history is provided by the patient.  Chest Pain  patient here complaining of sudden onset of sharp left-sided rib pain that occurred while he was working outside shoveling sand. Pain is worse with certain movements. Denies any anginal type symptoms. No associated dyspnea diaphoresis. Pain is better when he remains still. These became anxious and EMS was called. Denies any shortness of breath. No treatment used prior to arrival Past Medical History  Diagnosis Date  . Diabetes mellitus without complication    History reviewed. No pertinent past surgical history. Family History  Problem Relation Age of Onset  . Diabetes Other   . Diabetes Maternal Grandfather    History  Substance Use Topics  . Smoking status: Current Every Day Smoker -- 0.50 packs/day    Types: Cigarettes    Last Attempt to Quit: 07/18/2012  . Smokeless tobacco: Not on file  . Alcohol Use: No    Review of Systems  Cardiovascular: Positive for chest pain.  All other systems reviewed and are negative.    Allergies  Penicillins  Home Medications   Current Outpatient Rx  Name  Route  Sig  Dispense  Refill  . glucose monitoring kit (FREESTYLE) monitoring kit   Does not apply   1 each by Does not apply route 3 (three) times daily. Please dispense glucose monitor, strips and supplies.   1 each   prn   . metFORMIN (GLUCOPHAGE XR) 500 MG 24 hr tablet   Oral   Take 2 tablets (1,000 mg total) by mouth 2 (two) times daily with a meal.   120 tablet   3    BP 125/70  Pulse 86  Temp(Src) 97.9 F (36.6 C) (Oral)  Resp 18  SpO2 97% Physical Exam  Nursing note and vitals  reviewed. Constitutional: He is oriented to person, place, and time. He appears well-developed and well-nourished.  Non-toxic appearance. No distress.  HENT:  Head: Normocephalic and atraumatic.  Eyes: Conjunctivae, EOM and lids are normal. Pupils are equal, round, and reactive to light.  Neck: Normal range of motion. Neck supple. No tracheal deviation present. No mass present.  Cardiovascular: Normal rate, regular rhythm and normal heart sounds.  Exam reveals no gallop.   No murmur heard. Pulmonary/Chest: Effort normal and breath sounds normal. No stridor. No respiratory distress. He has no decreased breath sounds. He has no wheezes. He has no rhonchi. He has no rales.    Abdominal: Soft. Normal appearance and bowel sounds are normal. He exhibits no distension. There is no tenderness. There is no rebound and no CVA tenderness.  Musculoskeletal: Normal range of motion. He exhibits no edema and no tenderness.  Neurological: He is alert and oriented to person, place, and time. He has normal strength. No cranial nerve deficit or sensory deficit. GCS eye subscore is 4. GCS verbal subscore is 5. GCS motor subscore is 6.  Skin: Skin is warm and dry. No abrasion and no rash noted.  Psychiatric: He has a normal mood and affect. His speech is normal and behavior is normal.    ED Course  Procedures (including critical care time) Labs Reviewed  CBC  BASIC METABOLIC PANEL   No results found.  No diagnosis found.  MDM   Date: 03/23/2013  Rate: 85  Rhythm: normal sinus rhythm  QRS Axis: normal  Intervals: normal  ST/T Wave abnormalities: normal  Conduction Disutrbances:none  Narrative Interpretation:   Old EKG Reviewed: none available  Patient with left-sided chest wall pain that appears to be musculoskeletal in nature. Doubt that this represents ACS. Will be placed him [place] relaxants and is stable for discharge. He also notes that he is not been compliant with his diabetes medications and will  begin to take his Glucophage twice a day  Toy Baker, MD 03/23/13 1340

## 2013-03-23 NOTE — ED Notes (Signed)
Pt not in room.

## 2013-03-26 ENCOUNTER — Telehealth (HOSPITAL_COMMUNITY): Payer: Self-pay | Admitting: Emergency Medicine

## 2013-03-26 NOTE — ED Notes (Signed)
Daniel w/Employee Health calling trying to find form for pt r/t chain of custody and registration form for Urine drug screen.  Unable to find in Epic chart or scanned documents

## 2015-01-16 ENCOUNTER — Encounter (HOSPITAL_COMMUNITY): Payer: Self-pay | Admitting: Emergency Medicine

## 2015-01-16 ENCOUNTER — Emergency Department (HOSPITAL_COMMUNITY): Payer: PRIVATE HEALTH INSURANCE

## 2015-01-16 ENCOUNTER — Emergency Department (HOSPITAL_COMMUNITY)
Admission: EM | Admit: 2015-01-16 | Discharge: 2015-01-16 | Disposition: A | Discharge status: Home / Self Care | Payer: PRIVATE HEALTH INSURANCE | Attending: Emergency Medicine | Admitting: Emergency Medicine

## 2015-01-16 DIAGNOSIS — Y998 Other external cause status: Secondary | ICD-10-CM | POA: Diagnosis not present

## 2015-01-16 DIAGNOSIS — E119 Type 2 diabetes mellitus without complications: Secondary | ICD-10-CM | POA: Diagnosis not present

## 2015-01-16 DIAGNOSIS — Z880 Allergy status to penicillin: Secondary | ICD-10-CM | POA: Insufficient documentation

## 2015-01-16 DIAGNOSIS — M25512 Pain in left shoulder: Secondary | ICD-10-CM

## 2015-01-16 DIAGNOSIS — Z79899 Other long term (current) drug therapy: Secondary | ICD-10-CM | POA: Insufficient documentation

## 2015-01-16 DIAGNOSIS — Y9389 Activity, other specified: Secondary | ICD-10-CM | POA: Insufficient documentation

## 2015-01-16 DIAGNOSIS — Z720 Tobacco use: Secondary | ICD-10-CM | POA: Diagnosis not present

## 2015-01-16 DIAGNOSIS — W182XXA Fall in (into) shower or empty bathtub, initial encounter: Secondary | ICD-10-CM | POA: Insufficient documentation

## 2015-01-16 DIAGNOSIS — Y9289 Other specified places as the place of occurrence of the external cause: Secondary | ICD-10-CM | POA: Diagnosis not present

## 2015-01-16 DIAGNOSIS — S4992XA Unspecified injury of left shoulder and upper arm, initial encounter: Secondary | ICD-10-CM | POA: Diagnosis not present

## 2015-01-16 MED ORDER — TRAMADOL HCL 50 MG PO TABS: 50.0000 mg | ORAL_TABLET | Freq: Four times a day (QID) | ORAL | Status: DC | PRN

## 2015-01-16 MED ORDER — MELOXICAM 7.5 MG PO TABS
ORAL_TABLET | ORAL | Status: AC
  Filled 2015-01-16: qty 1

## 2015-01-16 MED ORDER — MELOXICAM 7.5 MG PO TABS: 7.5000 mg | ORAL_TABLET | Freq: Every day | ORAL | Status: DC

## 2015-01-16 MED ORDER — TRAMADOL HCL 50 MG PO TABS
50.0000 mg | ORAL_TABLET | Freq: Once | ORAL | Status: AC
  Administered 2015-01-16: 50 mg via ORAL
  Filled 2015-01-16: qty 1

## 2015-01-16 NOTE — ED Notes (Signed)
Patient reports fell in shower today and tried to brace himself with his hand. Complaining of left shoulder pain radiating into neck and upper back.

## 2015-01-16 NOTE — ED Notes (Signed)
Spoke with [place], report given, stated to tell officer he can take him back to "his home camp".

## 2015-01-16 NOTE — Discharge Instructions (Signed)
Shoulder Pain The shoulder is the joint that connects your arms to your body. The bones that form the shoulder joint include the upper arm bone (humerus), the shoulder blade (scapula), and the collarbone (clavicle). The top of the humerus is shaped like a ball and fits into a rather flat socket on the scapula (glenoid cavity). A combination of muscles and strong, fibrous tissues that connect muscles to bones (tendons) support your shoulder joint and hold the ball in the socket. Small, fluid-filled sacs (bursae) are located in different areas of the joint. They act as cushions between the bones and the overlying soft tissues and help reduce friction between the gliding tendons and the bone as you move your arm. Your shoulder joint allows a wide range of motion in your arm. This range of motion allows you to do things like scratch your back or throw a ball. However, this range of motion also makes your shoulder more prone to pain from overuse and injury. Causes of shoulder pain can originate from both injury and overuse and usually can be grouped in the following four categories:  Redness, swelling, and pain (inflammation) of the tendon (tendinitis) or the bursae (bursitis).  Instability, such as a dislocation of the joint.  Inflammation of the joint (arthritis).  Broken bone (fracture). HOME CARE INSTRUCTIONS   Apply ice to the sore area.  Put ice in a plastic bag.  Place a towel between your skin and the bag.  Leave the ice on for 15-20 minutes, 3-4 times per day for the first 2 days, or as directed by your health care provider.  Stop using cold packs if they do not help with the pain.  If you have a shoulder sling or immobilizer, wear it as long as your caregiver instructs. Only remove it to shower or bathe. Move your arm as little as possible, but keep your hand moving to prevent swelling.  Squeeze a soft ball or foam pad as much as possible to help prevent swelling.  Only take  over-the-counter or prescription medicines for pain, discomfort, or fever as directed by your caregiver. SEEK MEDICAL CARE IF:   Your shoulder pain increases, or new pain develops in your arm, hand, or fingers.  Your hand or fingers become cold and numb.  Your pain is not relieved with medicines. SEEK IMMEDIATE MEDICAL CARE IF:   Your arm, hand, or fingers are numb or tingling.  Your arm, hand, or fingers are significantly swollen or turn white or blue. MAKE SURE YOU:   Understand these instructions.  Will watch your condition.  Will get help right away if you are not doing well or get worse. Document Released: 06/13/2005 Document Revised: 01/18/2014 Document Reviewed: 08/18/2011 [facility] Patient Information 2015 [place], [place]. This information is not intended to replace advice given to you by your health care provider. Make sure you discuss any questions you have with your health care provider.   Your xrays are negative for any acute injury tonight, although I suspect you may have a rotator cuff injury (see the attached sheet for further information about this injury). I recommend obtaining an MRI of your shoulder if your symptoms persist or do not improve using the sling and the medicines prescribed for the next week.  You should apply ice to your shoulder joint for 15 minutes 5-6 times daily for the next several days.

## 2015-01-19 NOTE — ED Provider Notes (Signed)
CSN: [id number]     Arrival date & time 01/16/15  1851 History   First MD Initiated Contact with Patient 01/16/15 2028     Chief Complaint  Patient presents with  . Shoulder Pain     (Consider location/radiation/quality/duration/timing/severity/associated sxs/prior Treatment) Patient is a 44 y.o. male presenting with shoulder pain. The history is provided by the patient.  Shoulder Pain Location:  Shoulder Injury: yes   Mechanism of injury: fall   Mechanism of injury comment:  Fell in shower today, hyperextended his left shoulder when he caught himself on the shower rail. Fall:    Fall occurred:  In the bathroom   Height of fall:  2 feet   Impact surface: did not hit the floor, caught with hands on the shower rail. Shoulder location:  L shoulder Pain details:    Quality:  Aching and throbbing   Radiates to:  Back   Severity:  Moderate   Onset quality:  Sudden   Duration:  8 hours   Timing:  Constant   Progression:  Unchanged Chronicity:  New Handedness:  Right-handed Dislocation: He believes his shoulder dislocated, then popped back in.   Prior injury to area:  No Relieved by:  Nothing Worsened by:  Movement Ineffective treatments:  None tried Associated symptoms: no fever, no muscle weakness, no numbness and no tingling     Past Medical History  Diagnosis Date  . Diabetes mellitus without complication    History reviewed. No pertinent past surgical history. Family History  Problem Relation Age of Onset  . Diabetes Other   . Diabetes Maternal Grandfather    History  Substance Use Topics  . Smoking status: Current Every Day Smoker -- 0.50 packs/day    Types: Cigarettes    Last Attempt to Quit: 07/18/2012  . Smokeless tobacco: Not on file  . Alcohol Use: No    Review of Systems  Constitutional: Negative for fever.  Musculoskeletal: Positive for arthralgias. Negative for myalgias and joint swelling.  Neurological: Negative for weakness and numbness.       Allergies  Penicillins  Home Medications   Prior to Admission medications   Medication Sig Start Date End Date Taking? Authorizing Provider  glucose monitoring kit (FREESTYLE) monitoring kit 1 each by Does not apply route 3 (three) times daily. Please dispense glucose monitor, strips and supplies. 11/18/12   [name], PA-C  meloxicam (MOBIC) 7.5 MG tablet Take 1 tablet (7.5 mg total) by mouth daily. 01/16/15   [name], PA-C  metFORMIN (GLUCOPHAGE XR) 500 MG 24 hr tablet Take 2 tablets (1,000 mg total) by mouth 2 (two) times daily with a meal. 01/28/13   Clanford Marisa Hua, MD  methocarbamol (ROBAXIN-750) 750 MG tablet Take 1 tablet (750 mg total) by mouth 4 (four) times daily. 03/23/13   [name], MD  traMADol (ULTRAM) 50 MG tablet Take 1 tablet (50 mg total) by mouth every 6 (six) hours as needed. 01/16/15   [name], PA-C   BP 132/72 mmHg  Pulse 80  Temp(Src) 98.2 F (36.8 C) (Oral)  Resp 20  Ht _0  (1.727 m)  Wt 230 lb (104.327 kg)  BMI 34.98 kg/m2  SpO2 97% Physical Exam  Constitutional: He appears well-developed and well-nourished.  HENT:  Head: Atraumatic.  Neck: Normal range of motion.  Cardiovascular:  Pulses equal bilaterally  Musculoskeletal: He exhibits tenderness.       Left shoulder: He exhibits bony tenderness and decreased strength. He exhibits no swelling, no effusion,  no crepitus, no deformity and normal pulse.  Pain and weakness with attempted flexion and extension against resistance. Pain increased with attempt to flex  Beyond 90 degrees.  Equal grip strength.  TTP anterior shoulder.  Neurological: He is alert. He has normal strength. He displays normal reflexes. No sensory deficit.  Skin: Skin is warm and dry.  Psychiatric: He has a normal mood and affect.    ED Course  Procedures (including critical care time) Labs Review Labs Reviewed - No data to display  Imaging Review No results found.   EKG Interpretation None      MDM    Final diagnoses:  Shoulder pain, acute, left    Patients labs and/or radiological studies were reviewed and considered during the medical decision making and disposition process.  Results were also discussed with patient. Suspect possible rotator cuff injury.  No dislocation or fracture. Pt was placed in sling, meloxicam, tramadol prescribed.  Encouraged ice, rest. F/u with primary doctor at his [facility] facility. Advised he may need mri for further eval if pain does not improve with tx.  The patient appears reasonably screened and/or stabilized for discharge and I doubt any other medical condition or other [facility] requiring further screening, evaluation, or treatment in the ED at this time prior to discharge.     [name], PA-C 01/19/15 2446  Veryl Speak, MD 01/19/15 [phone]

## 2015-07-07 ENCOUNTER — Emergency Department (HOSPITAL_BASED_OUTPATIENT_CLINIC_OR_DEPARTMENT_OTHER)
Admission: EM | Admit: 2015-07-07 | Discharge: 2015-07-07 | Disposition: A | Discharge status: Home / Self Care | Payer: PRIVATE HEALTH INSURANCE | Attending: Emergency Medicine | Admitting: Emergency Medicine

## 2015-07-07 ENCOUNTER — Encounter (HOSPITAL_BASED_OUTPATIENT_CLINIC_OR_DEPARTMENT_OTHER): Payer: Self-pay | Admitting: Emergency Medicine

## 2015-07-07 DIAGNOSIS — Z880 Allergy status to penicillin: Secondary | ICD-10-CM | POA: Insufficient documentation

## 2015-07-07 DIAGNOSIS — Z791 Long term (current) use of non-steroidal anti-inflammatories (NSAID): Secondary | ICD-10-CM | POA: Insufficient documentation

## 2015-07-07 DIAGNOSIS — E119 Type 2 diabetes mellitus without complications: Secondary | ICD-10-CM | POA: Insufficient documentation

## 2015-07-07 DIAGNOSIS — Z760 Encounter for issue of repeat prescription: Secondary | ICD-10-CM | POA: Insufficient documentation

## 2015-07-07 DIAGNOSIS — Z79899 Other long term (current) drug therapy: Secondary | ICD-10-CM | POA: Insufficient documentation

## 2015-07-07 DIAGNOSIS — Z720 Tobacco use: Secondary | ICD-10-CM | POA: Insufficient documentation

## 2015-07-07 LAB — CBG MONITORING, ED: GLUCOSE-CAPILLARY: 265 mg/dL — AB (ref 65–99)

## 2015-07-07 MED ORDER — METFORMIN HCL 500 MG PO TABS
500.0000 mg | ORAL_TABLET | Freq: Once | ORAL | Status: AC
  Administered 2015-07-07: 500 mg via ORAL
  Filled 2015-07-07: qty 1

## 2015-07-07 MED ORDER — METFORMIN HCL 500 MG PO TABS: 500.0000 mg | ORAL_TABLET | Freq: Two times a day (BID) | ORAL | Status: DC

## 2015-07-07 NOTE — ED Notes (Signed)
Pt requesting medication refill metformin 1000 mg BID

## 2015-07-07 NOTE — Discharge Instructions (Signed)
Medicine Refill at the Emergency Department  We have refilled your medicine today, but it is best for you to get refills through your primary health care provider's office. In the future, please plan ahead so you do not need to get refills from the emergency department.  If the medicine we refilled was a maintenance medicine, you may have received only enough to get you by until you are able to see your regular health care provider.     This information is not intended to replace advice given to you by your health care provider. Make sure you discuss any questions you have with your health care provider.     Document Released: 12/21/2003 Document Revised: 09/24/2014 Document Reviewed: 12/11/2013  [employer]vier Interactive Patient Education 2016 [employer]

## 2015-07-07 NOTE — ED Provider Notes (Signed)
CSN: [id number]     Arrival date & time 07/07/15  2328 History  By signing my name below, I, Dan [name], attest that this documentation has been prepared under the direction and in the presence of Dan Reither, MD.  Electronically Signed: Tula [name], ED Scribe. 07/07/2015. 11:45 PM.   Chief Complaint  Patient presents with  . Medication Refill   Patient is a 44 y.o. male presenting with general illness. The history is provided by the patient. No language interpreter was used.  Illness Severity:  Moderate Onset quality:  Gradual Timing:  Constant Progression:  Unchanged Chronicity:  Chronic Context:  Off his metformin Relieved by:  Nothing Worsened by:  Nothing Associated symptoms: no abdominal pain, no fever, no shortness of breath and no vomiting     HPI Comments: Dan [name] is a 44 y.o. male who presents to the Emergency Department for a medication refill of Metformin. He ran out 2 years ago. Pt takes Metformin-500 mg BID. He denies any other complaints.  Past Medical History  Diagnosis Date  . Diabetes mellitus without complication ([employer])    History reviewed. No pertinent past surgical history. Family History  Problem Relation Age of Onset  . Diabetes Other   . Diabetes Maternal Grandfather    Social History  Substance Use Topics  . Smoking status: Current Every Day Smoker -- 0.50 packs/day    Types: Cigarettes    Last Attempt to Quit: 07/18/2012  . Smokeless tobacco: None  . Alcohol Use: No    Review of Systems  Constitutional: Negative for fever.       + Medication Refill  Respiratory: Negative for shortness of breath.   Gastrointestinal: Negative for vomiting and abdominal pain.  All other systems reviewed and are negative.  Allergies  Penicillins  Home Medications   Prior to Admission medications   Medication Sig Start Date End Date Taking? Authorizing Provider  glucose monitoring kit (FREESTYLE) monitoring kit 1 each by Does not apply  route 3 (three) times daily. Please dispense glucose monitor, strips and supplies. 11/18/12   [name], PA-C  meloxicam (MOBIC) 7.5 MG tablet Take 1 tablet (7.5 mg total) by mouth daily. 01/16/15   [name], PA-C  metFORMIN (GLUCOPHAGE XR) 500 MG 24 hr tablet Take 2 tablets (1,000 mg total) by mouth 2 (two) times daily with a meal. 01/28/13   Clanford Marisa Hua, MD  methocarbamol (ROBAXIN-750) 750 MG tablet Take 1 tablet (750 mg total) by mouth 4 (four) times daily. 03/23/13   [name], MD  traMADol (ULTRAM) 50 MG tablet Take 1 tablet (50 mg total) by mouth every 6 (six) hours as needed. 01/16/15   [name], PA-C   BP 134/74 mmHg  Pulse 90  Temp(Src) 98.1 F (36.7 C) (Oral)  Resp 18  Ht 5' 8.5" (1.74 m)  Wt 180 lb (81.647 kg)  BMI 26.97 kg/m2  SpO2 100% Physical Exam  Constitutional: He is oriented to person, place, and time. He appears well-developed and well-nourished. No distress.  HENT:  Head: Normocephalic and atraumatic.  Mouth/Throat: Oropharynx is clear and moist. No oropharyngeal exudate.  Eyes: Conjunctivae and EOM are normal. Pupils are equal, round, and reactive to light.  Neck: Normal range of motion. Neck supple. No tracheal deviation present.  Cardiovascular: Normal rate, regular rhythm and normal heart sounds.   Pulmonary/Chest: Effort normal and breath sounds normal. No respiratory distress.  Abdominal: Soft. Bowel sounds are normal. There is no tenderness. There is no rebound.  Slight constipation in the ascending  Musculoskeletal: Normal range of motion.  Neurological: He is alert and oriented to person, place, and time. He has normal reflexes.  Good DTRs lower  Skin: Skin is warm and dry.  Psychiatric: He has a normal mood and affect. His behavior is normal.  Nursing note and vitals reviewed.   ED Course  Procedures  DIAGNOSTIC STUDIES: Oxygen Saturation is 100% on RA, normal by my interpretation.    COORDINATION OF CARE: 11:46 PM Discussed treatment  plan with pt which includes refill of Metformin. He agreed to plan.   Labs Review Labs Reviewed - No data to display  Imaging Review No results found.   EKG Interpretation None      MDM   Final diagnoses:  None    metformin 500 mg BID # 60.  Follow up with your PMD for check of your HbA1c and ongoing care.    I, Kameko Hukill-RASCH,Gabriella Woodhead K, personally performed the services described in this documentation. All medical record entries made by the scribe were at my direction and in my presence.  I have reviewed the chart and discharge instructions and agree that the record reflects my personal performance and is accurate and complete. Yenty Bloch-RASCH,Jrake Rodriquez K.  07/08/2015. 12:14 AM.     Eldean Klatt, MD 07/08/15 0321

## 2015-07-30 ENCOUNTER — Emergency Department (HOSPITAL_BASED_OUTPATIENT_CLINIC_OR_DEPARTMENT_OTHER)
Admission: EM | Admit: 2015-07-30 | Discharge: 2015-07-30 | Disposition: A | Discharge status: Home / Self Care | Payer: Medicaid Other | Attending: Emergency Medicine | Admitting: Emergency Medicine

## 2015-07-30 ENCOUNTER — Encounter (HOSPITAL_BASED_OUTPATIENT_CLINIC_OR_DEPARTMENT_OTHER): Payer: Self-pay | Admitting: *Deleted

## 2015-07-30 DIAGNOSIS — R000 Tachycardia, unspecified: Secondary | ICD-10-CM | POA: Insufficient documentation

## 2015-07-30 DIAGNOSIS — F17210 Nicotine dependence, cigarettes, uncomplicated: Secondary | ICD-10-CM | POA: Insufficient documentation

## 2015-07-30 DIAGNOSIS — F1510 Other stimulant abuse, uncomplicated: Secondary | ICD-10-CM

## 2015-07-30 DIAGNOSIS — E1165 Type 2 diabetes mellitus with hyperglycemia: Secondary | ICD-10-CM | POA: Insufficient documentation

## 2015-07-30 DIAGNOSIS — Z7984 Long term (current) use of oral hypoglycemic drugs: Secondary | ICD-10-CM | POA: Insufficient documentation

## 2015-07-30 DIAGNOSIS — R739 Hyperglycemia, unspecified: Secondary | ICD-10-CM

## 2015-07-30 DIAGNOSIS — R21 Rash and other nonspecific skin eruption: Secondary | ICD-10-CM | POA: Insufficient documentation

## 2015-07-30 DIAGNOSIS — Z880 Allergy status to penicillin: Secondary | ICD-10-CM | POA: Insufficient documentation

## 2015-07-30 DIAGNOSIS — Z79899 Other long term (current) drug therapy: Secondary | ICD-10-CM | POA: Insufficient documentation

## 2015-07-30 DIAGNOSIS — Z791 Long term (current) use of non-steroidal anti-inflammatories (NSAID): Secondary | ICD-10-CM | POA: Insufficient documentation

## 2015-07-30 LAB — URINALYSIS, ROUTINE W REFLEX MICROSCOPIC
Bilirubin Urine: NEGATIVE
Glucose, UA: >1000 mg/dL — AB
Hgb urine dipstick: NEGATIVE
Ketones, ur: NEGATIVE mg/dL
Leukocytes, UA: NEGATIVE
Nitrite: NEGATIVE
Protein, ur: NEGATIVE mg/dL
Specific Gravity, Urine: 1.041 — ABNORMAL HIGH (ref 1.005–1.030)
Urobilinogen, UA: 2 mg/dL — ABNORMAL HIGH (ref 0.0–1.0)
pH: 6 (ref 5.0–8.0)

## 2015-07-30 LAB — URINE MICROSCOPIC-ADD ON

## 2015-07-30 LAB — CBG MONITORING, ED: Glucose-Capillary: 335 mg/dL — ABNORMAL HIGH (ref 65–99)

## 2015-07-30 MED ORDER — SODIUM CHLORIDE 0.9 % IV BOLUS (SEPSIS): 1000.0000 mL | Freq: Once | INTRAVENOUS | Status: DC

## 2015-07-30 MED ORDER — CLOTRIMAZOLE 1 % EX CREA: TOPICAL_CREAM | CUTANEOUS | Status: DC

## 2015-07-30 MED ORDER — METFORMIN HCL 1000 MG PO TABS: 1000.0000 mg | ORAL_TABLET | Freq: Two times a day (BID) | ORAL | Status: DC

## 2015-07-30 NOTE — Discharge Instructions (Signed)
Do not hesitate to return to the emergency room for any new, worsening or concerning symptoms. ° °Please obtain primary care using resource guide below. Let them know that you were seen in the emergency room and that they will need to obtain records for further outpatient management. ° ° ° °Emergency Department Resource Guide °1) Find a Doctor and Pay Out of Pocket °Although you won't have to find out who is covered by your insurance plan, it is a good idea to ask around and get recommendations. You will then need to call the office and see if the doctor you have chosen will accept you as a new patient and what types of options they offer for patients who are self-pay. Some doctors offer discounts or will set up payment plans for their patients who do not have insurance, but you will need to ask so you aren't surprised when you get to your appointment. ° °2) Contact Your Local Health Department °Not all health departments have doctors that can see patients for sick visits, but many do, so it is worth a call to see if yours does. If you don't know where your local health department is, you can check in your phone book. The CDC also has a tool to help you locate your state's health department, and many state websites also have listings of all of their local health departments. ° °3) Find a Walk-in Clinic °If your illness is not likely to be very severe or complicated, you may want to try a walk in clinic. These are popping up all over the country in pharmacies, drugstores, and shopping centers. They're usually staffed by nurse practitioners or physician assistants that have been trained to treat common illnesses and complaints. They're usually fairly quick and inexpensive. However, if you have serious medical issues or chronic medical problems, these are probably not your best option. ° °No Primary Care Doctor: °- Call [facility] at  832-8000 - they can help you locate a primary care doctor that  accepts your  insurance, provides certain services, etc. °- Physician Referral Service- 1-800-533-3463 ° °Chronic Pain Problems: °Organization         Address  Phone   Notes  °[facility]  (336) 297-2271 Patients need to be referred by their primary care doctor.  ° °Medication Assistance: °Organization         Address  Phone   Notes  °[place] [facility] [address] °[place], [place] [address] (336) 641-8030 --Must be a resident of [place] °-- Must have NO insurance coverage whatsoever (no Medicaid/ Medicare, etc.) °-- The pt. MUST have a primary care doctor that directs their care regularly and follows them in the community °  °[facility]  (866) 331-1348   °[employer]  (888) 892-1162   ° °Agencies that provide inexpensive medical care: °Organization         Address  Phone   Notes  °[facility]  (336) 832-8035   °[facility]    (336) 832-7272   °[facility] [address] °[place], [place] [address] (336) 832-4777   °[facility] [address], °[place] (336) 271-4999   °[facility]    (336) 373-0678   °[facility]    (336) 272-1050   °[facility] ° [address], [place] Phone:  (336) 832-4444, Fax:  (336) 832-4440 Hours of Operation:  9 am -   6 pm, M-F.  Also accepts Medicaid/Medicare and self-pay.  °[facility] ° [address], [place] Phone: (336) 832-3150, Fax: (336) 832-3151. Hours of Operation:  8:30 am - 5:30 pm, M-F.  Also accepts Medicaid and self-pay.  °[facility] [place] [address], [place] Phone: (336) 878-6027   °[facility] [address], [place], [place] (336)723-1848, Ext. 123 Mondays & Thursdays: 7-9 AM.  First 15 patients are seen on a first come, first serve basis. °  ° °Medicaid-accepting [place] Providers: ° °Organization          Address  Phone   Notes  °[facility] [address], [place] (336) 641-2100 Also accepts self-pay patients.  °[facility] [address], [place] ° (336) 856-9996   °[facility] [address], [place] (336) 288-8857   °[facility] [address] [place] [address], [place] (336) 299-7000   °Veita Bland [address], [place]  ° (336) 373-1557 Only accepts [place] Access Medicaid patients after they have their name applied to their card.  ° °Self-Pay (no insurance) in [place]: ° °Organization         Address  Phone   Notes  °Sickle Cell Patients, [facility] [address], [place] (336) 832-1970   °[facility] [address], [place] (336) 832-4400   °[facility] ° [address], [place] (336) 992-4800   °[facility]/Dr. Osei-Bonsu ° [address] [place] [address], [place] or [address], [place] (336) 841-8500 Phone number for both [place] and [place] locations is the same.  °[facility] [address], [place] (336) 299-0000   °[facility] [address] [place] [address], [place] or [address] (336) 852-7530 °(336) 878-2260   °[facility] [address], [place] (336) 350-1642, phone; (336) 294-5005, fax Sees patients 1st and 3rd Saturday of every month.  Must not qualify for public or private insurance (i.e. Medicaid, Medicare, [place] Health Choice, Veterans' Benefits) • Household income should be no more than 200% of the poverty level •The clinic cannot treat you if you are pregnant or think you are pregnant • Sexually transmitted diseases are not treated at the clinic.  ° ° °Dental Care: °Organization         Address  Phone  Notes  °[facility] [address],  [place] (336) 641-6152 Accepts children up to age 21 who are enrolled in Medicaid or [place] Health Choice; pregnant women with a Medicaid card; and children who have applied for Medicaid or [place] Health Choice, but were declined, whose parents can pay a reduced fee at time of service.  °[facility] [place]  [address], [place] (336) 641-7733 Accepts children up to age 21 who are enrolled in Medicaid or [place] Health Choice; pregnant women with a Medicaid card; and children who have applied for Medicaid or [place] Health Choice, but were declined, whose parents can pay a reduced fee at time of service.  °[facility] ° [address], [place] (336) 641-4533 Patients are seen by appointment only. Walk-ins are not accepted. [facility] will see patients 18 years of age and   older. °Monday - Tuesday (8am-5pm) °Most Wednesdays (8:30-5pm) °$30 per visit, cash only  °[facility] ° [address], [place] (336) 641-4533 Patients are seen by appointment only. Walk-ins are not accepted. [facility] will see patients 18 years of age and older. °One Wednesday Evening (Monthly: Volunteer Based).  $30 per visit, cash only  °[facility]  (919) 537-3737 for adults; Children under age 4, call Graduate Pediatric Dentistry at (919) [phone]. Children aged 4-14, please call (919) 537-3737 to request a pediatric application. ° Dental services are provided in all areas of dental care including fillings, crowns and bridges, complete and partial dentures, implants, gum treatment, root canals, and extractions. Preventive care is also provided. Treatment is provided to both adults and children. °Patients are selected via a lottery and there is often a waiting list. °  °[facility] [address], °[place] ° (336) 763-8833 [email or web address] °  °[facility] [address], [place], [place]  (336)723-1848, Ext. 123 Second and Fourth Thursday of each month, opens at 6:30 AM; Clinic ends at 9 AM.  Patients are seen on a first-come first-served basis, and a limited number are seen during each clinic.  ° °[facility] ° [address], [place], [place] (336) 723-7904   Eligibility Requirements °You must have lived in [place], [place], or [place] counties for at least the last three months. °  You cannot be eligible for state or federal sponsored healthcare insurance, including Veterans Administration, Medicaid, or Medicare. °  You generally cannot be eligible for healthcare insurance through your employer.  °  How to apply: °Eligibility screenings are held every Tuesday and Wednesday afternoon from 1:00 pm until 4:00 pm. You do not need an appointment for the interview!  °[facility] [address], [place], [place] 336-631-2330   °[facility]  336-342-8273   °[facility]  336-703-3100   °[facility]  336-570-6415   ° °Behavioral Health Resources in the Community: °Intensive Outpatient Programs °Organization         Address  Phone  Notes  °[facility] [address], [place], [place] 336-878-6098   °[facility] [address], [place], [place] 336-832-9800   °[facility] [address], [place], [place] ° 336-882-2125   °[facility] [address],  °[place], [place] 1-800-853-5163 or 336-641-4981   °Substance Abuse Resources °Organization         Address  Phone  Notes  °[facility]  336-882-2125   °[facility]  336-784-9470   °[facility]  336-285-9073   °[facility]  336-845-3988   °Residential & Outpatient Substance Abuse Program  1-800-659-3381   °Psychological Services °Organization         Address  Phone  Notes  °[facility]  336- 832-9600   °[facility]  336- 378-7881    °[facility] [address], [place] 1-800-853-5163 or 336-641-4981   ° °Mobile Crisis Teams °Organization         Address  Phone  Notes  °[facility], Mobile Crisis Care Unit  1-877-626-1772   °Assertive °[facility] ° [address] [place], [place] 336-834-9664   °Sharon DeEsch [address] ° [place] 336-554-5454   ° °Self-Help/Support Groups °Organization         Address    Phone             Notes  °[facility] - variety of support groups  336- 373-1402 Call for more information  °Narcotics Anonymous (NA), [facility] [address], °[place]  2 meetings at this location  ° °Residential Treatment Programs °Organization         Address  Phone  Notes  °[facility] [address],    °[place]  1-866-801-8205   °[facility] ° [address], [place], [place] 704-293-8524   °[facility] [address], [place] 336-845-3988 Admissions: 8am-3pm M-F  °[facility] [address]    °[place], [place] 336-841-1104   °[facility] [address], [place], [place] 336-379-7146   °[facility] [address]  °[place], [place] 336-285-9073   °[facility] Intensive Outpatient [address], [place], [place] 336-852-3033   °[facility] [address]  °[place], [place] 1-877-615-2722 or 336-784-9470   °[facility] [address] [place], [place] 336-227-7417 Accepts Medicaid  °[facility] [address]  ° [place] 1-800-659-3381 Substance Abuse/Addiction Treatment  ° °[place] Behavioral Health Resources °Organization         Address  Phone  Notes  °[facility]  (888) 581-9988   °Julie Brannon, PhD [address] [place], [place]   (336) 349-5553 or (336) 951-0000   ° Behavioral   [address] °[place], [place] (336) 349-4454   °[facility] [address], [place], [employer] [place] (336) 342-8316 Insurance/Medicaid/sponsorship through [facility]  °[facility] [address]                                    [place], [place] (336) 342-8316 Therapy/tele-psych/case  °[facility] [address]  ° [place], [place] (336) 349-2233    °Dr. Arfeen  (336) 349-4544   °[facility] [place]  [employer] [place] [facility] 1) [address], [place] °2) [address], [place] °3)  [address], [place] (336) 349-3220 °(336) 342-7768 ° °(336) 342-8140   °[place] [facility] (336) 342-1394 or (336) 342-3537 (After Hours)    ° ° ° °

## 2015-07-30 NOTE — ED Provider Notes (Signed)
CSN: [id number]     Arrival date & time 07/30/15  2206 History   First MD Initiated Contact with Patient 07/30/15 2224     Chief Complaint  Patient presents with  . Rash     (Consider location/radiation/quality/duration/timing/severity/associated sxs/prior Treatment) HPI   Blood pressure 125/89, pulse 108, temperature 98.5 F (36.9 C), temperature source Oral, resp. rate 20, height 5' 8.5" (1.74 m), weight 185 lb (83.915 kg), SpO2 100 %.  GAMAL TODISCO is a 44 y.o. male complaining of pruritic rash to 4 extremities and trunk onset 2 weeks ago, not necessarily worsening. Patient has been applying over-the-counter antibiotic ointment with little relief. Denies fever, chills, rash affecting the palms and soles. Patient is taking foremen 500 twice a day but states his normal doses 1000 bid. Does not have a primary care because he was recently released from prison. Patient thinks his sugar is high, he hasn't been checking it. He denies polyuria or polydipsia and states he actually has some difficulty initiating stream with no dysuria, hematuria. Denies chest pain, shortness of breath, abdominal pain.   Past Medical History  Diagnosis Date  . Diabetes mellitus without complication ([place])    History reviewed. No pertinent past surgical history. Family History  Problem Relation Age of Onset  . Diabetes Other   . Diabetes Maternal Grandfather    Social History  Substance Use Topics  . Smoking status: Current Every Day Smoker -- 0.50 packs/day    Types: Cigarettes    Last Attempt to Quit: 07/18/2012  . Smokeless tobacco: None  . Alcohol Use: No    Review of Systems  10 systems reviewed and found to be negative, except as noted in the HPI.  Allergies  Penicillins  Home Medications   Prior to Admission medications   Medication Sig Start Date End Date Taking? Authorizing Provider  glucose monitoring kit (FREESTYLE) monitoring kit 1 each by Does not apply route 3 (three) times  daily. Please dispense glucose monitor, strips and supplies. 11/18/12   [name], PA-C  meloxicam (MOBIC) 7.5 MG tablet Take 1 tablet (7.5 mg total) by mouth daily. 01/16/15   [name], PA-C  metFORMIN (GLUCOPHAGE XR) 500 MG 24 hr tablet Take 2 tablets (1,000 mg total) by mouth 2 (two) times daily with a meal. 01/28/13   Clanford Marisa Hua, MD  metFORMIN (GLUCOPHAGE) 500 MG tablet Take 1 tablet (500 mg total) by mouth 2 (two) times daily with a meal. 07/07/15   April Palumbo, MD  methocarbamol (ROBAXIN-750) 750 MG tablet Take 1 tablet (750 mg total) by mouth 4 (four) times daily. 03/23/13   [name], MD  traMADol (ULTRAM) 50 MG tablet Take 1 tablet (50 mg total) by mouth every 6 (six) hours as needed. 01/16/15   [name], PA-C   BP 125/89 mmHg  Pulse 108  Temp(Src) 98.5 F (36.9 C) (Oral)  Resp 20  Ht 5' 8.5" (1.74 m)  Wt 185 lb (83.915 kg)  BMI 27.72 kg/m2  SpO2 100% Physical Exam  Constitutional: He is oriented to person, place, and time. He appears well-developed and well-nourished. No distress.  HENT:  Head: Normocephalic and atraumatic.  Mouth/Throat: Oropharynx is clear and moist.  Eyes: Conjunctivae and EOM are normal. Pupils are equal, round, and reactive to light.  Neck: Normal range of motion.  Cardiovascular: Regular rhythm and intact distal pulses.   Mild tachycardia  Pulmonary/Chest: Effort normal and breath sounds normal.  Abdominal: Soft. There is no tenderness.  Musculoskeletal: Normal  range of motion.  Neurological: He is alert and oriented to person, place, and time.  Skin: Skin is warm. He is not diaphoretic.  Scales, hyperpigmented, slightly excoriated rash with no central clearing. Lesions spare the palms soles or mucous membranes. No warmth, tenderness palpation or discharge.  Psychiatric: He has a normal mood and affect.  Nursing note and vitals reviewed.   ED Course  Procedures (including critical care time) Labs Review Labs Reviewed  URINALYSIS,  ROUTINE W REFLEX MICROSCOPIC (NOT AT [facility]) - Abnormal; Notable for the following:    Specific Gravity, Urine 1.041 (*)    Glucose, UA >1000 (*)    Urobilinogen, UA 2.0 (*)    All other components within normal limits  CBG MONITORING, ED - Abnormal; Notable for the following:    Glucose-Capillary 335 (*)    All other components within normal limits  URINE MICROSCOPIC-ADD ON    Imaging Review No results found. I have personally reviewed and evaluated these images and lab results as part of my medical decision-making.   EKG Interpretation None      MDM   Final diagnoses:  Rash and nonspecific skin eruption  Hyperglycemia  Methamphetamine use    Filed Vitals:   07/30/15 2211 07/30/15 2259 07/30/15 2346  BP: 125/89 153/88 130/82  Pulse: 108 108 93  Temp: 98.5 F (36.9 C)  98.5 F (36.9 C)  TempSrc: Oral  Oral  Resp: 20 20 16   Height: 5' 8.5" (1.74 m)    Weight: 185 lb (83.915 kg)    SpO2: 100% 100% 100%    Dan [name] is 44 y.o. male presenting with pruritic rash over the course of 2 weeks. No signs of bacterial infection. No red flags. This may be dry skin versus tinea. Patient is tachycardic, considering his hyperglycemia like to obtain blood work and would like to fluid bolus. On further discussion with this patient he states that that is unnecessary, the reason his heart rate is fast because he smoked methamphetamine earlier this afternoon. He declines IV and blood work. I think this is reasonable. Urinalysis with no signs of infection, no ketones. Patient is given prescription for clotrimazole ointment and will increase his metformin to 1000 twice a day.  Evaluation does not show pathology that would require ongoing emergent intervention or inpatient treatment. Pt is hemodynamically stable and mentating appropriately. Discussed findings and plan with patient/guardian, who agrees with care plan. All questions answered. Return precautions discussed and outpatient follow up  given.   Discharge Medication List as of 07/30/2015 11:25 PM    START taking these medications   Details  clotrimazole (LOTRIMIN) 1 % cream Apply to affected area 2 times daily for at least 4 weeks ( for 1 week until after the lesions have healed), Print             Monico Blitz, PA-C 07/31/15 0001  Dorie Rank, MD 07/31/15 1538

## 2015-07-30 NOTE — ED Notes (Signed)
C/o rash all over, trunk and all 4 extremities. Onset 2 weeks ago. Raised, dry, itchy. Denies pain. Has been applying abx ointment. Concerned about infection d/t h/o DM. Also out of medications. Describes sugars as out of control.

## 2015-08-16 ENCOUNTER — Emergency Department (HOSPITAL_BASED_OUTPATIENT_CLINIC_OR_DEPARTMENT_OTHER)
Admission: EM | Admit: 2015-08-16 | Discharge: 2015-08-17 | Disposition: A | Discharge status: Home / Self Care | Payer: Medicaid Other | Attending: Emergency Medicine | Admitting: Emergency Medicine

## 2015-08-16 ENCOUNTER — Encounter (HOSPITAL_BASED_OUTPATIENT_CLINIC_OR_DEPARTMENT_OTHER): Payer: Self-pay | Admitting: Emergency Medicine

## 2015-08-16 DIAGNOSIS — R34 Anuria and oliguria: Secondary | ICD-10-CM | POA: Insufficient documentation

## 2015-08-16 DIAGNOSIS — R3915 Urgency of urination: Secondary | ICD-10-CM | POA: Insufficient documentation

## 2015-08-16 DIAGNOSIS — R300 Dysuria: Secondary | ICD-10-CM | POA: Insufficient documentation

## 2015-08-16 DIAGNOSIS — M549 Dorsalgia, unspecified: Secondary | ICD-10-CM | POA: Insufficient documentation

## 2015-08-16 DIAGNOSIS — Z880 Allergy status to penicillin: Secondary | ICD-10-CM | POA: Insufficient documentation

## 2015-08-16 DIAGNOSIS — Z7984 Long term (current) use of oral hypoglycemic drugs: Secondary | ICD-10-CM | POA: Insufficient documentation

## 2015-08-16 DIAGNOSIS — E119 Type 2 diabetes mellitus without complications: Secondary | ICD-10-CM | POA: Insufficient documentation

## 2015-08-16 DIAGNOSIS — Z79899 Other long term (current) drug therapy: Secondary | ICD-10-CM | POA: Insufficient documentation

## 2015-08-16 DIAGNOSIS — L309 Dermatitis, unspecified: Secondary | ICD-10-CM

## 2015-08-16 DIAGNOSIS — Z711 Person with feared health complaint in whom no diagnosis is made: Secondary | ICD-10-CM

## 2015-08-16 DIAGNOSIS — Z791 Long term (current) use of non-steroidal anti-inflammatories (NSAID): Secondary | ICD-10-CM | POA: Insufficient documentation

## 2015-08-16 DIAGNOSIS — Z202 Contact with and (suspected) exposure to infections with a predominantly sexual mode of transmission: Secondary | ICD-10-CM | POA: Insufficient documentation

## 2015-08-16 DIAGNOSIS — F17210 Nicotine dependence, cigarettes, uncomplicated: Secondary | ICD-10-CM | POA: Insufficient documentation

## 2015-08-16 HISTORY — DX: Other stimulant abuse, uncomplicated: F15.10

## 2015-08-16 NOTE — ED Provider Notes (Signed)
CSN: [id number]     Arrival date & time 08/16/15  2319 History  By signing my name below, I, Dan [name], attest that this documentation has been prepared under the direction and in the presence of [name], MD. Electronically Signed: Irene [name], ED Scribe. 08/16/2015. 11:47 PM.   Chief Complaint  Patient presents with  . Possible UTI   . Rash   The history is provided by the patient. No language interpreter was used.  HPI Comments: Dan [name] is a 44 y.o. male with a hx of DM and methamphetamine abuse who presents to the Emergency Department complaining of difficulty urinating and decreased urine output onset 2 weeks ago. Pt reports associated bilateral lower back pain. 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 Pt states that he was seen for an itching rash a few weeks ago and reports that it has been worsening on his left leg, but healing on the arms. Pt reports that it was diagnosed as a fungal infection and has been using lotions on the areas to some relief. He reports recent encounter with fleas and does not know if this is a cause for the rash. He states that he does not know how his sugars have been and reports new sexual partners. Pt denies fever, chills, nausea, vomiting, hematuria, or penile discharge.   Past Medical History  Diagnosis Date  . Diabetes mellitus without complication ([place])   . Methamphetamine abuse    History reviewed. No pertinent past surgical history. Family History  Problem Relation Age of Onset  . Diabetes Other   . Diabetes Maternal Grandfather    Social History  Substance Use Topics  . Smoking status: Current Every Day Smoker -- 0.50 packs/day    Types: Cigarettes    Last Attempt to Quit: 07/18/2012  . Smokeless tobacco: None  . Alcohol Use: No    Review of Systems  Constitutional: Negative for fever and chills.  Gastrointestinal: Negative for nausea and vomiting.  Genitourinary: Positive for decreased urine volume and difficulty  urinating. Negative for hematuria and discharge.  Musculoskeletal: Positive for back pain.  Skin: Positive for rash.  All other systems reviewed and are negative.   Allergies  Penicillins  Home Medications   Prior to Admission medications   Medication Sig Start Date End Date Taking? Authorizing Provider  clotrimazole (LOTRIMIN) 1 % cream Apply to affected area 2 times daily for at least 4 weeks ( for 1 week until after the lesions have healed) 07/30/15   [name], PA-C  glucose monitoring kit (FREESTYLE) monitoring kit 1 each by Does not apply route 3 (three) times daily. Please dispense glucose monitor, strips and supplies. 11/18/12   [name], PA-C  meloxicam (MOBIC) 7.5 MG tablet Take 1 tablet (7.5 mg total) by mouth daily. 01/16/15   [name], PA-C  metFORMIN (GLUCOPHAGE) 1000 MG tablet Take 1 tablet (1,000 mg total) by mouth 2 (two) times daily. 07/30/15   [name], PA-C  methocarbamol (ROBAXIN-750) 750 MG tablet Take 1 tablet (750 mg total) by mouth 4 (four) times daily. 03/23/13   [name], MD  tamsulosin (FLOMAX) 0.4 MG CAPS capsule Take 1 capsule (0.4 mg total) by mouth daily. 08/17/15   [name], MD  traMADol (ULTRAM) 50 MG tablet Take 1 tablet (50 mg total) by mouth every 6 (six) hours as needed. 01/16/15   [name], PA-C   BP 132/86 mmHg  Pulse 108  Temp(Src) 98 F (36.7 C) (Oral)  Resp 18  Ht 5' 8"  (1.727 m)  Wt 180 lb (81.647 kg)  BMI 27.38 kg/m2  SpO2 99% Physical Exam  Constitutional: He is oriented to person, place, and time. He appears well-developed and well-nourished. No distress.  HENT:  Head: Normocephalic and atraumatic.  Cardiovascular: Normal rate, regular rhythm and normal heart sounds.   No murmur heard. Pulmonary/Chest: Effort normal and breath sounds normal. No respiratory distress. He has no wheezes.  Abdominal: Soft. Bowel sounds are normal. There is no tenderness. There is no rebound and no guarding.  Genitourinary:   No CVA tenderness  Musculoskeletal: He exhibits no edema.  Bilateral lumbar paraspinous muscle tenderness to palpation without rebound or guarding  Neurological: He is alert and oriented to person, place, and time.  Skin: Skin is warm and dry.  Darkening and lichenification over the right lower leg, appears dry, multiple excoriations noted, no overlying erythema  Psychiatric: He has a normal mood and affect.  Nursing note and vitals reviewed.   ED Course  Procedures (including critical care time) DIAGNOSTIC STUDIES: Oxygen Saturation is 100% on RA, normal by my interpretation.    COORDINATION OF CARE: 11:47 PM-Discussed treatment plan which includes labs with pt at bedside and pt agreed to plan.    Labs Review Labs Reviewed  URINALYSIS, ROUTINE W REFLEX MICROSCOPIC (NOT AT [facility]) - Abnormal; Notable for the following:    Specific Gravity, Urine 1.031 (*)    Glucose, UA >1000 (*)    All other components within normal limits  URINE MICROSCOPIC-ADD ON - Abnormal; Notable for the following:    Bacteria, UA FEW (*)    Casts HYALINE CASTS (*)    All other components within normal limits  CBG MONITORING, ED - Abnormal; Notable for the following:    Glucose-Capillary 252 (*)    All other components within normal limits  GC/CHLAMYDIA PROBE AMP ([place]) NOT AT [facility]    Imaging Review No results found. I have personally reviewed and evaluated these images and lab results as part of my medical decision-making.   EKG Interpretation None      MDM   Final diagnoses:  Urinary urgency  Eczema  Concern about STD in male without diagnosis   Patient presents with multiple complaints.  Patient reports urinary urgency. This is not new. This has happened before.  He does report new sexual partners. He will be tested for STDs. He declines STD treatment.  He also reports continued rash. This is not improving with antifungals. Rash appears eczematous and patient does have dry skin.  Discussed with patient hydrocortisone and moisturizing cream. Urinalysis without evidence of urinary tract infection. CBG 252. Giving ongoing issues with urinary urgency and difficulty, patient may have undiagnosed BPH. We will do follow-up for urology.  After history, exam, and medical workup I feel the patient has been appropriately medically screened and is safe for discharge home. Pertinent diagnoses were discussed with the patient. Patient was given return precautions.  I personally performed the services described in this documentation, which was scribed in my presence. The recorded information has been reviewed and is accurate.    [name], MD 08/17/15 [phone]

## 2015-08-16 NOTE — ED Notes (Signed)
Pt states that he has been having difficulty urinating with back pain since 11/12, reports symptoms are getting worse. Also says that the rash he was seen here for is not getting better. Rash appears to be healing, no discharge or redness. States he has been scratching the scabs when he gets out of shower.

## 2015-08-17 LAB — URINE MICROSCOPIC-ADD ON
RBC / HPF: NONE SEEN RBC/hpf (ref 0–5)
SQUAMOUS EPITHELIAL / LPF: NONE SEEN

## 2015-08-17 LAB — URINALYSIS, ROUTINE W REFLEX MICROSCOPIC
Bilirubin Urine: NEGATIVE
Glucose, UA: >1000 mg/dL — AB
Hgb urine dipstick: NEGATIVE
KETONES UR: NEGATIVE mg/dL
LEUKOCYTES UA: NEGATIVE
NITRITE: NEGATIVE
PH: 6 (ref 5.0–8.0)
PROTEIN: NEGATIVE mg/dL
Specific Gravity, Urine: 1.031 — ABNORMAL HIGH (ref 1.005–1.030)

## 2015-08-17 LAB — CBG MONITORING, ED: Glucose-Capillary: 252 mg/dL — ABNORMAL HIGH (ref 65–99)

## 2015-08-17 MED ORDER — TAMSULOSIN HCL 0.4 MG PO CAPS: 0.4000 mg | ORAL_CAPSULE | Freq: Every day | ORAL | Status: DC

## 2015-08-17 NOTE — Discharge Instructions (Signed)
You were seen today for multiple symptoms. Your rash appears somewhat eczematous. You can try over-the-counter hydrocortisone cream. He also had urinary frequency and urgency. No evidence of urinary tract infection and he had been tested for STDs. This may be related to your diabetes or prostate. See above referral for urology if symptoms persist.  Urinary Frequency The number of times a normal person urinates depends upon how much liquid they take in and how much liquid they are losing. If the temperature is hot and there is high humidity, then the person will sweat more and usually breathe a little more frequently. These factors decrease the amount of frequency of urination that would be considered normal. The amount you drink is easily determined, but the amount of fluid lost is sometimes more difficult to calculate.  Fluid is lost in two ways:  Sensible fluid loss is usually measured by the amount of urine that you get rid of. Losses of fluid can also occur with diarrhea.  Insensible fluid loss is more difficult to measure. It is caused by evaporation. Insensible loss of fluid occurs through breathing and sweating. It usually ranges from a little less than a quart to a little more than a quart of fluid a day. In normal temperatures and activity levels, the average person may urinate 4 to 7 times in a 24-hour period. Needing to urinate more often than that could indicate a problem. If one urinates 4 to 7 times in 24 hours and has large volumes each time, that could indicate a different problem from one who urinates 4 to 7 times a day and has small volumes. The time of urinating is also important. Most urinating should be done during the waking hours. Getting up at night to urinate frequently can indicate some problems. CAUSES  The bladder is the organ in your lower abdomen that holds urine. Like a balloon, it swells some as it fills up. Your nerves sense this and tell you it is time to head for the  bathroom. There are a number of reasons that you might feel the need to urinate more often than usual. They include:  Urinary tract infection. This is usually associated with other signs such as burning when you urinate.  In men, problems with the prostate (a walnut-size gland that is located near the tube that carries urine out of your body). There are two reasons why the prostate can cause an increased frequency of urination:  An enlarged prostate that does not let the bladder empty well. If the bladder only half empties when you urinate, then it only has half the capacity to fill before you have to urinate again.  The nerves in the bladder become more hypersensitive with an increased size of the prostate even if the bladder empties completely.  Pregnancy.  Obesity. Excess weight is more likely to cause a problem for women than for men.  Bladder stones or other bladder problems.  Caffeine.  Alcohol.  Medications. For example, drugs that help the body get rid of extra fluid (diuretics) increase urine production. Some other medicines must be taken with lots of fluids.  Muscle or nerve weakness. This might be the result of a spinal cord injury, a stroke, multiple sclerosis, or Parkinson disease.  Long-standing diabetes can decrease the sensation of the bladder. This loss of sensation makes it harder to sense the bladder needs to be emptied. Over a period of years, the bladder is stretched out by constant overfilling. This weakens the bladder muscles  so that the bladder does not empty well and has less capacity to fill with new urine.  Interstitial cystitis (also called painful bladder syndrome). This condition develops because the tissues that line the inside of the bladder are inflamed (inflammation is the body's way of reacting to injury or infection). It causes pain and frequent urination. It occurs in women more often than in men. DIAGNOSIS   To decide what might be causing your  urinary frequency, your health care provider will probably:  Ask about symptoms you have noticed.  Ask about your overall health. This will include questions about any medications you are taking.  Do a physical examination.  Order some tests. These might include:  A blood test to check for diabetes or other health issues that could be contributing to the problem.  Urine testing. This could measure the flow of urine and the pressure on the bladder.  A test of your neurological system (the brain, spinal cord, and nerves). This is the system that senses the need to urinate.  A bladder test to check whether it is emptying completely when you urinate.  Cystoscopy. This test uses a thin tube with a tiny camera on it. It offers a look inside your urethra and bladder to see if there are problems.  Imaging tests. You might be given a contrast dye and then asked to urinate. X-rays are taken to see how your bladder is working. TREATMENT  It is important for you to be evaluated to determine if the amount or frequency that you have is unusual or abnormal. If it is found to be abnormal, the cause should be determined and this can usually be found out easily. Depending upon the cause, treatment could include medication, stimulation of the nerves, or surgery. There are not too many things that you can do as an individual to change your urinary frequency. It is important that you balance the amount of fluid intake needed to compensate for your activity and the temperature. Medical problems will be diagnosed and taken care of by your physician. There is no particular bladder training such as Kegel exercises that you can do to help urinary frequency. This is an exercise that is usually recommended for people who have leaking of urine when they laugh, cough, or sneeze. HOME CARE INSTRUCTIONS   Take any medications your health care provider prescribed or suggested. Follow the directions carefully.  Practice  any lifestyle changes that are recommended. These might include:  Drinking less fluid or drinking at different times of the day. If you need to urinate often during the night, for example, you may need to stop drinking fluids early in the evening.  Cutting down on caffeine or alcohol. They both can make you need to urinate more often than normal. Caffeine is found in coffee, tea, and sodas.  Losing weight, if that is recommended.  Keep a journal or a log. You might be asked to record how much you drink and when and where you feel the need to urinate. This will also help evaluate how well the treatment provided by your physician is working. SEEK MEDICAL CARE IF:   Your need to urinate often gets worse.  You feel increased pain or irritation when you urinate.  You notice blood in your urine.  You have questions about any medications that your health care provider recommended.  You notice blood, pus, or swelling at the site of any test or treatment procedure.  You develop a fever of more  than 100.1F (38.1C). SEEK IMMEDIATE MEDICAL CARE IF:  You develop a fever of more than 102.69F (38.9C).   This information is not intended to replace advice given to you by your health care provider. Make sure you discuss any questions you have with your health care provider.   Document Released: 06/30/2009 Document Revised: 09/24/2014 Document Reviewed: 06/30/2009 Elsevier Interactive Patient Education 2016 Elsevier Inc[employer] Eczema Eczema, also called atopic dermatitis, is a skin disorder that causes inflammation of the skin. It causes a red rash and dry, scaly skin. The skin becomes very itchy. Eczema is generally worse during the cooler winter months and often improves with the warmth of summer. Eczema usually starts showing signs in infancy. Some children outgrow eczema, but it may last through adulthood.  CAUSES  The exact cause of eczema is not known, but it appears to run in families. People with  eczema often have a family history of eczema, allergies, asthma, or hay fever. Eczema is not contagious. Flare-ups of the condition may be caused by:   Contact with something you are sensitive or allergic to.   Stress. SIGNS AND SYMPTOMS  Dry, scaly skin.   Red, itchy rash.   Itchiness. This may occur before the skin rash and may be very intense.  DIAGNOSIS  The diagnosis of eczema is usually made based on symptoms and medical history. TREATMENT  Eczema cannot be cured, but symptoms usually can be controlled with treatment and other strategies. A treatment plan might include:  Controlling the itching and scratching.   Use over-the-counter antihistamines as directed for itching. This is especially useful at night when the itching tends to be worse.   Use over-the-counter steroid creams as directed for itching.   Avoid scratching. Scratching makes the rash and itching worse. It may also result in a skin infection (impetigo) due to a break in the skin caused by scratching.   Keeping the skin well moisturized with creams every day. This will seal in moisture and help prevent dryness. Lotions that contain alcohol and water should be avoided because they can dry the skin.   Limiting exposure to things that you are sensitive or allergic to (allergens).   Recognizing situations that cause stress.   Developing a plan to manage stress.  HOME CARE INSTRUCTIONS   Only take over-the-counter or prescription medicines as directed by your health care provider.   Do not use anything on the skin without checking with your health care provider.   Keep baths or showers short (5 minutes) in warm (not hot) water. Use mild cleansers for bathing. These should be unscented. You may add nonperfumed bath oil to the bath water. It is best to avoid soap and bubble bath.   Immediately after a bath or shower, when the skin is still damp, apply a moisturizing ointment to the entire body. This  ointment should be a petroleum ointment. This will seal in moisture and help prevent dryness. The thicker the ointment, the better. These should be unscented.   Keep fingernails cut short. Children with eczema may need to wear soft gloves or mittens at night after applying an ointment.   Dress in clothes made of cotton or cotton blends. Dress lightly, because heat increases itching.   A child with eczema should stay away from anyone with fever blisters or cold sores. The virus that causes fever blisters (herpes simplex) can cause a serious skin infection in children with eczema. SEEK MEDICAL CARE IF:   Your itching interferes with sleep.  Your rash gets worse or is not better within 1 week after starting treatment.   You see pus or soft yellow scabs in the rash area.   You have a fever.   You have a rash flare-up after contact with someone who has fever blisters.    This information is not intended to replace advice given to you by your health care provider. Make sure you discuss any questions you have with your health care provider.   Document Released: 08/31/2000 Document Revised: 06/24/2013 Document Reviewed: 04/06/2013 Elsevier Interactive Patient Education [employer]2016 Else[employer] Inc[employer]

## 2015-10-10 ENCOUNTER — Emergency Department (HOSPITAL_BASED_OUTPATIENT_CLINIC_OR_DEPARTMENT_OTHER)
Admission: EM | Admit: 2015-10-10 | Discharge: 2015-10-10 | Disposition: A | Discharge status: Home / Self Care | Payer: Medicaid Other | Attending: Emergency Medicine | Admitting: Emergency Medicine

## 2015-10-10 ENCOUNTER — Encounter (HOSPITAL_BASED_OUTPATIENT_CLINIC_OR_DEPARTMENT_OTHER): Payer: Self-pay | Admitting: Emergency Medicine

## 2015-10-10 ENCOUNTER — Emergency Department (HOSPITAL_BASED_OUTPATIENT_CLINIC_OR_DEPARTMENT_OTHER): Payer: Medicaid Other

## 2015-10-10 DIAGNOSIS — Z760 Encounter for issue of repeat prescription: Secondary | ICD-10-CM | POA: Insufficient documentation

## 2015-10-10 DIAGNOSIS — Z79899 Other long term (current) drug therapy: Secondary | ICD-10-CM | POA: Insufficient documentation

## 2015-10-10 DIAGNOSIS — M545 Low back pain: Secondary | ICD-10-CM | POA: Insufficient documentation

## 2015-10-10 DIAGNOSIS — R3915 Urgency of urination: Secondary | ICD-10-CM | POA: Insufficient documentation

## 2015-10-10 DIAGNOSIS — R350 Frequency of micturition: Secondary | ICD-10-CM | POA: Insufficient documentation

## 2015-10-10 DIAGNOSIS — Z7984 Long term (current) use of oral hypoglycemic drugs: Secondary | ICD-10-CM | POA: Insufficient documentation

## 2015-10-10 DIAGNOSIS — Z880 Allergy status to penicillin: Secondary | ICD-10-CM | POA: Insufficient documentation

## 2015-10-10 DIAGNOSIS — R39198 Other difficulties with micturition: Secondary | ICD-10-CM | POA: Insufficient documentation

## 2015-10-10 DIAGNOSIS — R34 Anuria and oliguria: Secondary | ICD-10-CM | POA: Insufficient documentation

## 2015-10-10 DIAGNOSIS — F17210 Nicotine dependence, cigarettes, uncomplicated: Secondary | ICD-10-CM | POA: Insufficient documentation

## 2015-10-10 DIAGNOSIS — E119 Type 2 diabetes mellitus without complications: Secondary | ICD-10-CM | POA: Insufficient documentation

## 2015-10-10 DIAGNOSIS — R079 Chest pain, unspecified: Secondary | ICD-10-CM | POA: Insufficient documentation

## 2015-10-10 DIAGNOSIS — J189 Pneumonia, unspecified organism: Secondary | ICD-10-CM

## 2015-10-10 DIAGNOSIS — J159 Unspecified bacterial pneumonia: Secondary | ICD-10-CM | POA: Insufficient documentation

## 2015-10-10 LAB — BASIC METABOLIC PANEL
ANION GAP: 11 (ref 5–15)
BUN: 13 mg/dL (ref 6–20)
CHLORIDE: 99 mmol/L — AB (ref 101–111)
CO2: 25 mmol/L (ref 22–32)
Calcium: 8.8 mg/dL — ABNORMAL LOW (ref 8.9–10.3)
Creatinine, Ser: 0.77 mg/dL (ref 0.61–1.24)
GFR calc Af Amer: >60 mL/min (ref >60)
GLUCOSE: 583 mg/dL — AB (ref 65–99)
POTASSIUM: 4.8 mmol/L (ref 3.5–5.1)
SODIUM: 135 mmol/L (ref 135–145)

## 2015-10-10 LAB — URINE MICROSCOPIC-ADD ON: Bacteria, UA: NONE SEEN

## 2015-10-10 LAB — CBG MONITORING, ED: Glucose-Capillary: 516 mg/dL — ABNORMAL HIGH (ref 65–99)

## 2015-10-10 LAB — I-STAT VENOUS BLOOD GAS, ED
ACID-BASE EXCESS: 3 mmol/L — AB (ref 0.0–2.0)
BICARBONATE: 27.6 meq/L — AB (ref 20.0–24.0)
O2 Saturation: 83 %
TCO2: 29 mmol/L (ref 0–100)
pCO2, Ven: 41.3 mmHg — ABNORMAL LOW (ref 45.0–50.0)
pH, Ven: 7.432 — ABNORMAL HIGH (ref 7.250–7.300)
pO2, Ven: 47 mmHg — ABNORMAL HIGH (ref 30.0–45.0)

## 2015-10-10 LAB — CBC WITH DIFFERENTIAL/PLATELET
BLASTS: 0 %
Band Neutrophils: 3 %
Basophils Absolute: 0 10*3/uL (ref 0.0–0.1)
Basophils Relative: 0 %
Eosinophils Absolute: 0.1 10*3/uL (ref 0.0–0.7)
Eosinophils Relative: 1 %
HCT: 42.2 % (ref 39.0–52.0)
Hemoglobin: 14.2 g/dL (ref 13.0–17.0)
LYMPHS PCT: 33 %
Lymphs Abs: 3.8 10*3/uL (ref 0.7–4.0)
MCH: 28.8 pg (ref 26.0–34.0)
MCHC: 33.6 g/dL (ref 30.0–36.0)
MCV: 85.6 fL (ref 78.0–100.0)
MONOS PCT: 7 %
Metamyelocytes Relative: 0 %
Monocytes Absolute: 0.8 10*3/uL (ref 0.1–1.0)
Myelocytes: 1 %
NEUTROS ABS: 6.7 10*3/uL (ref 1.7–7.7)
Neutrophils Relative %: 54 %
OTHER: 0 %
PLATELETS: 215 10*3/uL (ref 150–400)
Promyelocytes Absolute: 1 %
RBC: 4.93 MIL/uL (ref 4.22–5.81)
RDW: 12.6 % (ref 11.5–15.5)
WBC: 11.4 10*3/uL — AB (ref 4.0–10.5)
nRBC: 0 /100 WBC

## 2015-10-10 LAB — URINALYSIS, ROUTINE W REFLEX MICROSCOPIC
BILIRUBIN URINE: NEGATIVE
HGB URINE DIPSTICK: NEGATIVE
KETONES UR: NEGATIVE mg/dL
Leukocytes, UA: NEGATIVE
Nitrite: NEGATIVE
PROTEIN: NEGATIVE mg/dL
Specific Gravity, Urine: 1.031 — ABNORMAL HIGH (ref 1.005–1.030)
pH: 6.5 (ref 5.0–8.0)

## 2015-10-10 MED ORDER — CEPHALEXIN 500 MG PO CAPS: 500.0000 mg | ORAL_CAPSULE | Freq: Four times a day (QID) | ORAL | Status: DC

## 2015-10-10 MED ORDER — SODIUM CHLORIDE 0.9 % IV BOLUS (SEPSIS)
2000.0000 mL | Freq: Once | INTRAVENOUS | Status: AC
  Administered 2015-10-10: 1000 mL via INTRAVENOUS

## 2015-10-10 MED ORDER — METFORMIN HCL 1000 MG PO TABS: 1000.0000 mg | ORAL_TABLET | Freq: Two times a day (BID) | ORAL | Status: DC

## 2015-10-10 MED ORDER — BENZONATATE 100 MG PO CAPS
100.0000 mg | ORAL_CAPSULE | Freq: Once | ORAL | Status: AC
  Administered 2015-10-10: 100 mg via ORAL
  Filled 2015-10-10: qty 1

## 2015-10-10 MED ORDER — AZITHROMYCIN 250 MG PO TABS: 250.0000 mg | ORAL_TABLET | Freq: Every day | ORAL | Status: DC

## 2015-10-10 MED FILL — metFORMIN HCL 1000 MG TABS: 1000 | 30 days supply | Qty: 60 | Fill #0

## 2015-10-10 NOTE — Discharge Instructions (Signed)
The doctor listed provided will see for little or no money for family doctor. Call the urology office and ask about different payment options see you.   Community-Acquired Pneumonia, Adult Pneumonia is an infection of the lungs. There are different types of pneumonia. One type can develop while a person is in a hospital. A different type, called community-acquired pneumonia, develops in people who are not, or have not recently been, in the hospital or other health care facility.  CAUSES Pneumonia may be caused by bacteria, viruses, or funguses. Community-acquired pneumonia is often caused by Streptococcus pneumonia bacteria. These bacteria are often passed from one person to another by breathing in droplets from the cough or sneeze of an infected person. RISK FACTORS The condition is more likely to develop in:  People who havechronic diseases, such as chronic obstructive pulmonary disease (COPD), asthma, congestive heart failure, cystic fibrosis, diabetes, or kidney disease.  People who haveearly-stage or late-stage HIV.  People who havesickle cell disease.  People who havehad their spleen removed (splenectomy).  People who havepoor Administrator.  People who havemedical conditions that increase the risk of breathing in (aspirating) secretions their own mouth and nose.   People who havea weakened immune system (immunocompromised).  People who smoke.  People whotravel to areas where pneumonia-causing germs commonly exist.  People whoare around animal habitats or animals that have pneumonia-causing germs, including birds, bats, rabbits, cats, and farm animals. SYMPTOMS Symptoms of this condition include:  Adry cough.  A wet (productive) cough.  Fever.  Sweating.  Chest pain, especially when breathing deeply or coughing.  Rapid breathing or difficulty breathing.  Shortness of breath.  Shaking chills.  Fatigue.  Muscle aches. DIAGNOSIS Your health care  provider will take a medical history and perform a physical exam. You may also have other tests, including:  Imaging studies of your chest, including X-rays.  Tests to check your blood oxygen level and other blood gases.  Other tests on blood, mucus (sputum), fluid around your lungs (pleural fluid), and urine. If your pneumonia is severe, other tests may be done to identify the specific cause of your illness. TREATMENT The type of treatment that you receive depends on many factors, such as the cause of your pneumonia, the medicines you take, and other medical conditions that you have. For most adults, treatment and recovery from pneumonia may occur at home. In some cases, treatment must happen in a hospital. Treatment may include:  Antibiotic medicines, if the pneumonia was caused by bacteria.  Antiviral medicines, if the pneumonia was caused by a virus.  Medicines that are given by mouth or through an IV tube.  Oxygen.  Respiratory therapy. Although rare, treating severe pneumonia may include:  Mechanical ventilation. This is done if you are not breathing well on your own and you cannot maintain a safe blood oxygen level.  Thoracentesis. This procedureremoves fluid around one lung or both lungs to help you breathe better. HOME CARE INSTRUCTIONS  Take over-the-counter and prescription medicines only as told by your health care provider.  Only takecough medicine if you are losing sleep. Understand that cough medicine can prevent your body's natural ability to remove mucus from your lungs.  If you were prescribed an antibiotic medicine, take it as told by your health care provider. Do not stop taking the antibiotic even if you start to feel better.  Sleep in a semi-upright position at night. Try sleeping in a reclining chair, or place a few pillows under your head.  Do  not use tobacco products, including cigarettes, chewing tobacco, and e-cigarettes. If you need help quitting, ask  your health care provider.  Drink enough water to keep your urine clear or pale yellow. This will help to thin out mucus secretions in your lungs. PREVENTION There are ways that you can decrease your risk of developing community-acquired pneumonia. Consider getting a pneumococcal vaccine if:  You are older than 47 years of age.  You are older than 47 years of age and are undergoing cancer treatment, have chronic lung disease, or have other medical conditions that affect your immune system. Ask your health care provider if this applies to you. There are different types and schedules of pneumococcal vaccines. Ask your health care provider which vaccination option is best for you. You may also prevent community-acquired pneumonia if you take these actions:  Get an influenza vaccine every year. Ask your health care provider which type of influenza vaccine is best for you.  Go to the dentist on a regular basis.  Wash your hands often. Use hand sanitizer if soap and water are not available. SEEK MEDICAL CARE IF:  You have a fever.  You are losing sleep because you cannot control your cough with cough medicine. SEEK IMMEDIATE MEDICAL CARE IF:  You have worsening shortness of breath.  You have increased chest pain.  Your sickness becomes worse, especially if you are an older adult or have a weakened immune system.  You cough up blood.   This information is not intended to replace advice given to you by your health care provider. Make sure you discuss any questions you have with your health care provider.   Document Released: 09/03/2005 Document Revised: 05/25/2015 Document Reviewed: 12/29/2014 Elsevier Interactive Patient Education [employer]

## 2015-10-10 NOTE — ED Notes (Signed)
Patient transported to X-ray 

## 2015-10-10 NOTE — ED Notes (Signed)
45 yo with cold symptoms x1 week reports cough and runny nose but denies fever. Also states he has not taken his metformin x2 weeks because he is out of it. Concerns for lower back pain and urinary symptoms. States he does not have a PCP.

## 2015-10-10 NOTE — ED Provider Notes (Signed)
CSN: [id number]     Arrival date & time 10/10/15  8882 History   First MD Initiated Contact with Patient 10/10/15 [phone]     Chief Complaint  Patient presents with  . URI  . Medication Refill     (Consider location/radiation/quality/duration/timing/severity/associated sxs/prior Treatment) Patient is a 45 y.o. male presenting with URI.  URI Presenting symptoms: congestion and cough   Presenting symptoms: no fever   Severity:  Moderate Onset quality:  Gradual Duration:  3 days Timing:  Constant Progression:  Worsening Chronicity:  New Relieved by:  Nothing Worsened by:  Nothing tried Ineffective treatments:  None tried Associated symptoms: no arthralgias, no headaches, no myalgias, no neck pain, no sinus pain and no sneezing    45 yo M with a chief complaint of cough congestion. Patient also states that he is concerned about increased urinary frequency and hesitancy. Patient states he's had difficulty urinating for the past 3 or 4 months. Denies fevers or chills. Has had some lower posterior pelvic cramping. Denies difficulty with his bowels. Patient has been seen for similar symptoms in the past. He has not seen a urologist as he feels he is unable to pay for the visit.  Past Medical History  Diagnosis Date  . Diabetes mellitus without complication ([place])   . Methamphetamine abuse    History reviewed. No pertinent past surgical history. Family History  Problem Relation Age of Onset  . Diabetes Other   . Diabetes Maternal Grandfather    Social History  Substance Use Topics  . Smoking status: Current Every Day Smoker -- 0.50 packs/day    Types: Cigarettes    Last Attempt to Quit: 07/18/2012  . Smokeless tobacco: None  . Alcohol Use: No    Review of Systems  Constitutional: Negative for fever and chills.  HENT: Positive for congestion. Negative for facial swelling and sneezing.   Eyes: Negative for discharge and visual disturbance.  Respiratory: Positive for cough.  Negative for shortness of breath.   Cardiovascular: Positive for chest pain (right upper sided with coughing). Negative for palpitations.  Gastrointestinal: Negative for vomiting, abdominal pain and diarrhea.  Genitourinary: Positive for urgency, frequency, decreased urine volume and difficulty urinating. Negative for penile swelling, scrotal swelling, penile pain and testicular pain.  Musculoskeletal: Positive for back pain (low back bilaterally). Negative for myalgias, arthralgias and neck pain.  Skin: Negative for color change and rash.  Neurological: Negative for tremors, syncope and headaches.  Psychiatric/Behavioral: Negative for confusion and dysphoric mood.      Allergies  Penicillins  Home Medications   Prior to Admission medications   Medication Sig Start Date End Date Taking? Authorizing Provider  azithromycin (ZITHROMAX) 250 MG tablet Take 1 tablet (250 mg total) by mouth daily. Take first 2 tablets together, then 1 every day until finished. 10/10/15   Deno Etienne, DO  cephALEXin (KEFLEX) 500 MG capsule Take 1 capsule (500 mg total) by mouth 4 (four) times daily. 10/10/15   Deno Etienne, DO  clotrimazole (LOTRIMIN) 1 % cream Apply to affected area 2 times daily for at least 4 weeks ( for 1 week until after the lesions have healed) 07/30/15   [name], PA-C  glucose monitoring kit (FREESTYLE) monitoring kit 1 each by Does not apply route 3 (three) times daily. Please dispense glucose monitor, strips and supplies. 11/18/12   [name], PA-C  meloxicam (MOBIC) 7.5 MG tablet Take 1 tablet (7.5 mg total) by mouth daily. 01/16/15   [name], PA-C  metFORMIN (  GLUCOPHAGE) 1000 MG tablet Take 1 tablet (1,000 mg total) by mouth 2 (two) times daily. 10/10/15   Deno Etienne, DO  methocarbamol (ROBAXIN-750) 750 MG tablet Take 1 tablet (750 mg total) by mouth 4 (four) times daily. 03/23/13   [name], MD  tamsulosin (FLOMAX) 0.4 MG CAPS capsule Take 1 capsule (0.4 mg total) by mouth daily.  08/17/15   [name], MD  traMADol (ULTRAM) 50 MG tablet Take 1 tablet (50 mg total) by mouth every 6 (six) hours as needed. 01/16/15   [name], PA-C   BP 108/64 mmHg  Pulse 78  Temp(Src) 98.2 F (36.8 C) (Oral)  Resp 18  Ht _0  (1.727 m)  SpO2 100% Physical Exam  Constitutional: He is oriented to person, place, and time. He appears well-developed and well-nourished.  HENT:  Head: Normocephalic and atraumatic.  Eyes: EOM are normal. Pupils are equal, round, and reactive to light.  Neck: Normal range of motion. Neck supple. No JVD present.  Cardiovascular: Normal rate and regular rhythm.  Exam reveals no gallop and no friction rub.   No murmur heard. Pulmonary/Chest: No respiratory distress. He has no wheezes. He has no rales.  Abdominal: He exhibits no distension. There is no tenderness. There is no rebound and no guarding.  Genitourinary:  Declined rectal and prostate exam  Musculoskeletal: Normal range of motion.  Neurological: He is alert and oriented to person, place, and time.  Skin: No rash noted. No pallor.  Psychiatric: He has a normal mood and affect. His behavior is normal.  Nursing note and vitals reviewed.   ED Course  Procedures (including critical care time) Labs Review Labs Reviewed  BASIC METABOLIC PANEL - Abnormal; Notable for the following:    Chloride 99 (*)    Glucose, Bld 583 (*)    Calcium 8.8 (*)    All other components within normal limits  CBC WITH DIFFERENTIAL/PLATELET - Abnormal; Notable for the following:    WBC 11.4 (*)    All other components within normal limits  URINALYSIS, ROUTINE W REFLEX MICROSCOPIC (NOT AT [facility]) - Abnormal; Notable for the following:    Specific Gravity, Urine 1.031 (*)    Glucose, UA >1000 (*)    All other components within normal limits  URINE MICROSCOPIC-ADD ON - Abnormal; Notable for the following:    Squamous Epithelial / LPF 0-5 (*)    All other components within normal limits  CBG MONITORING, ED -  Abnormal; Notable for the following:    Glucose-Capillary 516 (*)    All other components within normal limits  I-STAT VENOUS BLOOD GAS, ED - Abnormal; Notable for the following:    pH, Ven 7.432 (*)    pCO2, Ven 41.3 (*)    pO2, Ven 47.0 (*)    Bicarbonate 27.6 (*)    Acid-Base Excess 3.0 (*)    All other components within normal limits  BLOOD GAS, VENOUS    Imaging Review Dg Chest 2 View  10/10/2015  CLINICAL DATA:  Cough, runny nose all week. EXAM: CHEST  2 VIEW COMPARISON:  03/23/2013 FINDINGS: Airspace opacity in the right lower lobe compatible with pneumonia. Left lung is clear. Heart is normal size. No effusions. No acute bony abnormality. IMPRESSION: Right lower lobe pneumonia. Followup PA and lateral chest X-ray is recommended in 3-4 weeks following trial of antibiotic therapy to ensure resolution and exclude underlying malignancy. Electronically Signed   By: Rolm Baptise M.D.   On: 10/10/2015 08:59   I  have personally reviewed and evaluated these images and lab results as part of my medical decision-making.   EKG Interpretation None      MDM   Final diagnoses:  CAP (community acquired pneumonia)  Urinary frequency    45 yo M with a chief complaint of cough congestion as well as difficulty urinating. Patient is fully emptying his bladder. Patient is well-appearing and nontoxic on exam. Found to have right lower lobe pneumonia. Point of care blood sugar was 516. Due to this we will rule out DKA. Give the patient 2 L of fluids. He has not been taking his home metformin for the past couple weeks. He is currently chewing gum that is not sugar free. Patient would like urology to see him. Discussed that he needs to call the urologist and work out how to possibly see them in the office.   Patient is not in DKA. Will discharge home. Given prescription for his metformin.  12:32 PM:  I have discussed the diagnosis/risks/treatment options with the patient and believe the pt to be  eligible for discharge home to follow-up with PCP. We also discussed returning to the ED immediately if new or worsening sx occur. We discussed the sx which are most concerning (e.g., sudden worsening pain, fever, inability to tolerate by mouth) that necessitate immediate return. Medications administered to the patient during their visit and any new prescriptions provided to the patient are listed below.  Medications given during this visit Medications  benzonatate (TESSALON) capsule 100 mg (100 mg Oral Given 10/10/15 0848)  sodium chloride 0.9 % bolus 2,000 mL (0 mLs Intravenous Stopped 10/10/15 1057)    Discharge Medication List as of 10/10/2015  9:11 AM    START taking these medications   Details  azithromycin (ZITHROMAX) 250 MG tablet Take 1 tablet (250 mg total) by mouth daily. Take first 2 tablets together, then 1 every day until finished., Starting 10/10/2015, Until Discontinued, Print    cephALEXin (KEFLEX) 500 MG capsule Take 1 capsule (500 mg total) by mouth 4 (four) times daily., Starting 10/10/2015, Until Discontinued, Print        The patient appears reasonably screen and/or stabilized for discharge and I doubt any other medical condition or other [facility] requiring further screening, evaluation, or treatment in the ED at this time prior to discharge.      Deno Etienne, DO 10/10/15 1232

## 2015-10-21 MED FILL — AZITHROMYCIN 250 MG TABLET: 250 | 5 days supply | Qty: 6 | Fill #0

## 2015-12-22 ENCOUNTER — Emergency Department (HOSPITAL_BASED_OUTPATIENT_CLINIC_OR_DEPARTMENT_OTHER): Payer: Medicaid Other

## 2015-12-22 ENCOUNTER — Emergency Department (HOSPITAL_BASED_OUTPATIENT_CLINIC_OR_DEPARTMENT_OTHER)
Admission: EM | Admit: 2015-12-22 | Discharge: 2015-12-22 | Disposition: A | Discharge status: Home / Self Care | Payer: Medicaid Other | Attending: Emergency Medicine | Admitting: Emergency Medicine

## 2015-12-22 ENCOUNTER — Encounter (HOSPITAL_BASED_OUTPATIENT_CLINIC_OR_DEPARTMENT_OTHER): Payer: Self-pay | Admitting: *Deleted

## 2015-12-22 DIAGNOSIS — F17210 Nicotine dependence, cigarettes, uncomplicated: Secondary | ICD-10-CM | POA: Insufficient documentation

## 2015-12-22 DIAGNOSIS — Z79899 Other long term (current) drug therapy: Secondary | ICD-10-CM | POA: Insufficient documentation

## 2015-12-22 DIAGNOSIS — J159 Unspecified bacterial pneumonia: Secondary | ICD-10-CM | POA: Insufficient documentation

## 2015-12-22 DIAGNOSIS — J189 Pneumonia, unspecified organism: Secondary | ICD-10-CM

## 2015-12-22 DIAGNOSIS — Z792 Long term (current) use of antibiotics: Secondary | ICD-10-CM | POA: Insufficient documentation

## 2015-12-22 DIAGNOSIS — E119 Type 2 diabetes mellitus without complications: Secondary | ICD-10-CM | POA: Insufficient documentation

## 2015-12-22 DIAGNOSIS — R630 Anorexia: Secondary | ICD-10-CM | POA: Insufficient documentation

## 2015-12-22 DIAGNOSIS — Z791 Long term (current) use of non-steroidal anti-inflammatories (NSAID): Secondary | ICD-10-CM | POA: Insufficient documentation

## 2015-12-22 DIAGNOSIS — Z880 Allergy status to penicillin: Secondary | ICD-10-CM | POA: Insufficient documentation

## 2015-12-22 LAB — CBC WITH DIFFERENTIAL/PLATELET
Band Neutrophils: 2 %
Basophils Absolute: 0 10*3/uL (ref 0.0–0.1)
Basophils Relative: 0 %
Eosinophils Absolute: 0.1 10*3/uL (ref 0.0–0.7)
Eosinophils Relative: 1 %
HCT: 39 % (ref 39.0–52.0)
Hemoglobin: 13.2 g/dL (ref 13.0–17.0)
Lymphocytes Relative: 41 %
Lymphs Abs: 4 10*3/uL (ref 0.7–4.0)
MCH: 29.9 pg (ref 26.0–34.0)
MCHC: 33.8 g/dL (ref 30.0–36.0)
MCV: 88.4 fL (ref 78.0–100.0)
Metamyelocytes Relative: 1 %
Monocytes Absolute: 0.4 10*3/uL (ref 0.1–1.0)
Monocytes Relative: 4 %
Neutro Abs: 5.3 10*3/uL (ref 1.7–7.7)
Neutrophils Relative %: 51 %
Platelets: 224 10*3/uL (ref 150–400)
RBC: 4.41 MIL/uL (ref 4.22–5.81)
RDW: 13 % (ref 11.5–15.5)
WBC: 9.8 10*3/uL (ref 4.0–10.5)

## 2015-12-22 LAB — BASIC METABOLIC PANEL
Anion gap: 10 (ref 5–15)
BUN: 16 mg/dL (ref 6–20)
CO2: 23 mmol/L (ref 22–32)
Calcium: 8.5 mg/dL — ABNORMAL LOW (ref 8.9–10.3)
Chloride: 103 mmol/L (ref 101–111)
Creatinine, Ser: 0.68 mg/dL (ref 0.61–1.24)
GFR calc Af Amer: >60 mL/min (ref >60)
GFR calc non Af Amer: >60 mL/min (ref >60)
Glucose, Bld: 256 mg/dL — ABNORMAL HIGH (ref 65–99)
Potassium: 4.4 mmol/L (ref 3.5–5.1)
Sodium: 136 mmol/L (ref 135–145)

## 2015-12-22 MED ORDER — METFORMIN HCL 1000 MG PO TABS: 1000.0000 mg | ORAL_TABLET | Freq: Two times a day (BID) | ORAL | Status: DC

## 2015-12-22 MED ORDER — LEVOFLOXACIN 750 MG PO TABS: 750.0000 mg | ORAL_TABLET | Freq: Every day | ORAL | Status: DC

## 2015-12-22 MED ORDER — BENZONATATE 100 MG PO CAPS: 100.0000 mg | ORAL_CAPSULE | Freq: Three times a day (TID) | ORAL | Status: DC

## 2015-12-22 MED ORDER — IPRATROPIUM-ALBUTEROL 0.5-2.5 (3) MG/3ML IN SOLN
3.0000 mL | Freq: Four times a day (QID) | RESPIRATORY_TRACT | Status: DC
  Administered 2015-12-22: 3 mL via RESPIRATORY_TRACT
  Filled 2015-12-22: qty 3

## 2015-12-22 MED FILL — levoFLOXacin 750 MG TABS: 750 | 5 days supply | Qty: 5 | Fill #0

## 2015-12-22 MED FILL — metFORMIN HCL 1000 MG TABS: 1000 | 15 days supply | Qty: 30 | Fill #0

## 2015-12-22 NOTE — ED Notes (Signed)
Cough for over a week 

## 2015-12-22 NOTE — ED Notes (Signed)
Pa  at bedside. 

## 2015-12-22 NOTE — ED Notes (Signed)
Patient transported to X-ray 

## 2015-12-22 NOTE — ED Provider Notes (Signed)
CSN: [id number]     Arrival date & time 12/22/15  1551 History   First MD Initiated Contact with Patient 12/22/15 1604     Chief Complaint  Patient presents with  . Cough     HPI   45 year old Dan [name] presents today with complaints of cough. Patient reports that he was diagnosed with pneumonia on 10/10/2015. Patient reports symptoms resolved, but had recurrence of similar symptoms approximately 1.5 weeks ago. Patient reports that he has not had any fevers, chest pain, shortness of breath, dizziness, decreased appetite this current infection. He reports minor rhinorrhea and very minimal congestion. Patient reports he is a smoker. Patient has not tried any medications prior to arrival. Patient notes that he is a diabetic, does not have insurance and has not been able to follow up with primary care for medical management. Patient reports he has not taken metformin in several weeks.   Past Medical History  Diagnosis Date  . Diabetes mellitus without complication ([place])   . Methamphetamine abuse    History reviewed. No pertinent past surgical history. Family History  Problem Relation Age of Onset  . Diabetes Other   . Diabetes Maternal Grandfather    Social History  Substance Use Topics  . Smoking status: Current Every Day Smoker -- 0.50 packs/day    Types: Cigarettes    Last Attempt to Quit: 07/18/2012  . Smokeless tobacco: None  . Alcohol Use: No    Review of Systems  All other systems reviewed and are negative.   Allergies  Penicillins  Home Medications   Prior to Admission medications   Medication Sig Start Date End Date Taking? Authorizing Provider  azithromycin (ZITHROMAX) 250 MG tablet Take 1 tablet (250 mg total) by mouth daily. Take first 2 tablets together, then 1 every day until finished. 10/10/15   Deno Etienne, DO  benzonatate (TESSALON) 100 MG capsule Take 1 capsule (100 mg total) by mouth every 8 (eight) hours. 12/22/15   Okey Regal, PA-C  cephALEXin (KEFLEX) 500 MG  capsule Take 1 capsule (500 mg total) by mouth 4 (four) times daily. 10/10/15   Deno Etienne, DO  clotrimazole (LOTRIMIN) 1 % cream Apply to affected area 2 times daily for at least 4 weeks ( for 1 week until after the lesions have healed) 07/30/15   [name], PA-C  glucose monitoring kit (FREESTYLE) monitoring kit 1 each by Does not apply route 3 (three) times daily. Please dispense glucose monitor, strips and supplies. 11/18/12   [name], PA-C  levofloxacin (LEVAQUIN) 750 MG tablet Take 1 tablet (750 mg total) by mouth daily. 12/22/15   Okey Regal, PA-C  meloxicam (MOBIC) 7.5 MG tablet Take 1 tablet (7.5 mg total) by mouth daily. 01/16/15   [name], PA-C  metFORMIN (GLUCOPHAGE) 1000 MG tablet Take 1 tablet (1,000 mg total) by mouth 2 (two) times daily. 12/22/15   Okey Regal, PA-C  methocarbamol (ROBAXIN-750) 750 MG tablet Take 1 tablet (750 mg total) by mouth 4 (four) times daily. 03/23/13   [name], MD  tamsulosin (FLOMAX) 0.4 MG CAPS capsule Take 1 capsule (0.4 mg total) by mouth daily. 08/17/15   [name], MD  traMADol (ULTRAM) 50 MG tablet Take 1 tablet (50 mg total) by mouth every 6 (six) hours as needed. 01/16/15   [name], PA-C   BP 126/93 mmHg  Pulse 92  Temp(Src) 98.4 F (36.9 C) (Oral)  Resp 20  Ht 5' 8.5" (1.74 m)  Wt 81.647 kg  BMI  26.97 kg/m2  SpO2 99%   Physical Exam  Constitutional: He is oriented to person, place, and time. He appears well-developed and well-nourished.  HENT:  Head: Normocephalic and atraumatic.  Eyes: Conjunctivae are normal. Pupils are equal, round, and reactive to light. Right eye exhibits no discharge. Left eye exhibits no discharge. No scleral icterus.  Neck: Normal range of motion. No JVD present. No tracheal deviation present.  Pulmonary/Chest: Effort normal. No stridor. No respiratory distress. He has wheezes. He has no rales. He exhibits no tenderness.  Wheeze and crackle in right middle lobe  Neurological: He is  alert and oriented to person, place, and time. Coordination normal.  Psychiatric: He has a normal mood and affect. His behavior is normal. Judgment and thought content normal.  Nursing note and vitals reviewed.   ED Course  Procedures (including critical care time) Labs Review Labs Reviewed  BASIC METABOLIC PANEL - Abnormal; Notable for the following:    Glucose, Bld 256 (*)    Calcium 8.5 (*)    All other components within normal limits  CBC WITH DIFFERENTIAL/PLATELET  PATHOLOGIST SMEAR REVIEW  HIV ANTIBODY (ROUTINE TESTING)  RPR    Imaging Review Dg Chest 2 View  12/22/2015  CLINICAL DATA:  Cough for 1 week. EXAM: CHEST  2 VIEW COMPARISON:  10/10/2015 FINDINGS: There has been partial clearing of right lung pneumonia. On the lateral radiograph there is a persistent opacity within the right lower lobe worrisome for either residual or recurrent pneumonia. IMPRESSION: 1. Suspect residual or recurrent pneumonia in the right lung base. Followup PA and lateral chest X-ray is recommended in 3-4 weeks following trial of antibiotic therapy to ensure resolution and exclude underlying malignancy. Electronically Signed   By: Kerby Moors M.D.   On: 12/22/2015 16:30   I have personally reviewed and evaluated these images and lab results as part of my medical decision-making.   EKG Interpretation None      MDM   Final diagnoses:  Community acquired pneumonia    Labs:  Imaging: DG chest- residual recurrent pneumonia right lung base  Consults:  Therapeutics: [employer]  Discharge Meds: Levaquin, Tessalon Perles, metformin  Assessment/Plan: 45 year old Dan [name] presents today with complaints of cough. Patient had a diagnosis of pneumonia on 10/10/2015. Patient had resolution of symptoms, this is likely a recurrence. Patient is afebrile, nontoxic in no acute distress. Due to patient's recurrence he was placed on Levaquin, I also drew HIV syphilis labs here. 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 Patient will need follow-up in 3 days with primary care provider for reevaluation and further management. He will need reevaluation again in 3-4 weeks for chest x-ray to ensure resolution and exclude underlying malignancy. Patient will be referred to [facility] health [facility] wellness, he is instructed to contact them, contact his primary care provider and obtain follow-up evaluation with either resource. Patient is given strict return caution is, he verbalized understanding and agreement today's plan had no further questions or concerns at the time of discharge.         Okey Regal, PA-C 12/22/15 1918  Veryl Speak, MD 12/22/15 [phone]

## 2015-12-22 NOTE — Discharge Instructions (Signed)

## 2015-12-23 LAB — RPR: RPR Ser Ql: NONREACTIVE

## 2015-12-23 LAB — PATHOLOGIST SMEAR REVIEW

## 2015-12-23 LAB — HIV ANTIBODY (ROUTINE TESTING W REFLEX): HIV Screen 4th Generation wRfx: NONREACTIVE

## 2016-01-24 ENCOUNTER — Emergency Department (HOSPITAL_BASED_OUTPATIENT_CLINIC_OR_DEPARTMENT_OTHER)
Admission: EM | Admit: 2016-01-24 | Discharge: 2016-01-24 | Disposition: A | Discharge status: Home / Self Care | Payer: Medicaid Other | Attending: Emergency Medicine | Admitting: Emergency Medicine

## 2016-01-24 ENCOUNTER — Encounter (HOSPITAL_BASED_OUTPATIENT_CLINIC_OR_DEPARTMENT_OTHER): Payer: Self-pay | Admitting: *Deleted

## 2016-01-24 DIAGNOSIS — S01412A Laceration without foreign body of left cheek and temporomandibular area, initial encounter: Secondary | ICD-10-CM | POA: Insufficient documentation

## 2016-01-24 DIAGNOSIS — E1165 Type 2 diabetes mellitus with hyperglycemia: Secondary | ICD-10-CM | POA: Insufficient documentation

## 2016-01-24 DIAGNOSIS — Y929 Unspecified place or not applicable: Secondary | ICD-10-CM | POA: Insufficient documentation

## 2016-01-24 DIAGNOSIS — Z23 Encounter for immunization: Secondary | ICD-10-CM | POA: Insufficient documentation

## 2016-01-24 DIAGNOSIS — W228XXA Striking against or struck by other objects, initial encounter: Secondary | ICD-10-CM | POA: Insufficient documentation

## 2016-01-24 DIAGNOSIS — Y999 Unspecified external cause status: Secondary | ICD-10-CM | POA: Insufficient documentation

## 2016-01-24 DIAGNOSIS — R739 Hyperglycemia, unspecified: Secondary | ICD-10-CM

## 2016-01-24 DIAGNOSIS — Z7984 Long term (current) use of oral hypoglycemic drugs: Secondary | ICD-10-CM | POA: Insufficient documentation

## 2016-01-24 DIAGNOSIS — Y939 Activity, unspecified: Secondary | ICD-10-CM | POA: Insufficient documentation

## 2016-01-24 DIAGNOSIS — F17210 Nicotine dependence, cigarettes, uncomplicated: Secondary | ICD-10-CM | POA: Insufficient documentation

## 2016-01-24 DIAGNOSIS — S0181XA Laceration without foreign body of other part of head, initial encounter: Secondary | ICD-10-CM

## 2016-01-24 LAB — URINALYSIS, ROUTINE W REFLEX MICROSCOPIC
BILIRUBIN URINE: NEGATIVE
Glucose, UA: >1000 mg/dL — AB
Hgb urine dipstick: NEGATIVE
Ketones, ur: NEGATIVE mg/dL
Leukocytes, UA: NEGATIVE
NITRITE: NEGATIVE
PH: 6.5 (ref 5.0–8.0)
Protein, ur: NEGATIVE mg/dL
SPECIFIC GRAVITY, URINE: 1.029 (ref 1.005–1.030)

## 2016-01-24 LAB — CBG MONITORING, ED: GLUCOSE-CAPILLARY: 415 mg/dL — AB (ref 65–99)

## 2016-01-24 LAB — BASIC METABOLIC PANEL
ANION GAP: 7 (ref 5–15)
BUN: 16 mg/dL (ref 6–20)
CALCIUM: 9.1 mg/dL (ref 8.9–10.3)
CO2: 26 mmol/L (ref 22–32)
Chloride: 104 mmol/L (ref 101–111)
Creatinine, Ser: 0.79 mg/dL (ref 0.61–1.24)
GLUCOSE: 432 mg/dL — AB (ref 65–99)
Potassium: 4.5 mmol/L (ref 3.5–5.1)
SODIUM: 137 mmol/L (ref 135–145)

## 2016-01-24 LAB — CBC WITH DIFFERENTIAL/PLATELET
BASOS PCT: 1 %
Basophils Absolute: 0 10*3/uL (ref 0.0–0.1)
Eosinophils Absolute: 0.3 10*3/uL (ref 0.0–0.7)
Eosinophils Relative: 5 %
HEMATOCRIT: 42.7 % (ref 39.0–52.0)
HEMOGLOBIN: 14.4 g/dL (ref 13.0–17.0)
LYMPHS ABS: 2.4 10*3/uL (ref 0.7–4.0)
Lymphocytes Relative: 40 %
MCH: 29.6 pg (ref 26.0–34.0)
MCHC: 33.7 g/dL (ref 30.0–36.0)
MCV: 87.9 fL (ref 78.0–100.0)
MONOS PCT: 7 %
Monocytes Absolute: 0.4 10*3/uL (ref 0.1–1.0)
NEUTROS ABS: 2.8 10*3/uL (ref 1.7–7.7)
NEUTROS PCT: 47 %
Platelets: 179 10*3/uL (ref 150–400)
RBC: 4.86 MIL/uL (ref 4.22–5.81)
RDW: 13.4 % (ref 11.5–15.5)
WBC: 6 10*3/uL (ref 4.0–10.5)

## 2016-01-24 LAB — URINE MICROSCOPIC-ADD ON: Squamous Epithelial / LPF: NONE SEEN

## 2016-01-24 MED ORDER — LIDOCAINE-EPINEPHRINE 2 %-1:100000 IJ SOLN
20.0000 mL | Freq: Once | INTRAMUSCULAR | Status: AC
  Administered 2016-01-24: 1 mL via INTRADERMAL
  Filled 2016-01-24: qty 1

## 2016-01-24 MED ORDER — METFORMIN HCL 1000 MG PO TABS: 1000.0000 mg | ORAL_TABLET | Freq: Two times a day (BID) | ORAL | Status: DC

## 2016-01-24 MED ORDER — TETANUS-DIPHTH-ACELL PERTUSSIS 5-2.5-18.5 LF-MCG/0.5 IM SUSP
0.5000 mL | Freq: Once | INTRAMUSCULAR | Status: AC
  Administered 2016-01-24: 0.5 mL via INTRAMUSCULAR
  Filled 2016-01-24: qty 0.5

## 2016-01-24 MED ORDER — SODIUM CHLORIDE 0.9 % IV BOLUS (SEPSIS)
1000.0000 mL | Freq: Once | INTRAVENOUS | Status: AC
Start: 2016-01-24 — End: 2016-01-24
  Administered 2016-01-24: 1000 mL via INTRAVENOUS

## 2016-01-24 NOTE — Discharge Instructions (Signed)
Facial Laceration ° A facial laceration is a cut on the face. These injuries can be painful and cause bleeding. Lacerations usually heal quickly, but they need special care to reduce scarring. °DIAGNOSIS  °Your health care provider will take a medical history, ask for details about how the injury occurred, and examine the wound to determine how deep the cut is. °TREATMENT  °Some facial lacerations may not require closure. Others may not be able to be closed because of an increased risk of infection. The risk of infection and the chance for successful closure will depend on various factors, including the amount of time since the injury occurred. °The wound may be cleaned to help prevent infection. If closure is appropriate, pain medicines may be given if needed. Your health care provider will use stitches (sutures), wound glue (adhesive), or skin adhesive strips to repair the laceration. These tools bring the skin edges together to allow for faster healing and a better cosmetic outcome. If needed, you may also be given a tetanus shot. °HOME CARE INSTRUCTIONS °· Only take over-the-counter or prescription medicines as directed by your health care provider. °· Follow your health care provider's instructions for wound care. These instructions will vary depending on the technique used for closing the wound. °For Sutures: °· Keep the wound clean and dry.   °· If you were given a bandage (dressing), you should change it at least once a day. Also change the dressing if it becomes wet or dirty, or as directed by your health care provider.   °· Wash the wound with soap and water 2 times a day. Rinse the wound off with water to remove all soap. Pat the wound dry with a clean towel.   °· After cleaning, apply a thin layer of the antibiotic ointment recommended by your health care provider. This will help prevent infection and keep the dressing from sticking.   °· You may shower as usual after the first 24 hours. Do not soak the  wound in water until the sutures are removed.   °· Get your sutures removed as directed by your health care provider. With facial lacerations, sutures should usually be taken out after 4-5 days to avoid stitch marks.   °· Wait a few days after your sutures are removed before applying any makeup. °For Skin Adhesive Strips: °· Keep the wound clean and dry.   °· Do not get the skin adhesive strips wet. You may bathe carefully, using caution to keep the wound dry.   °· If the wound gets wet, pat it dry with a clean towel.   °· Skin adhesive strips will fall off on their own. You may trim the strips as the wound heals. Do not remove skin adhesive strips that are still stuck to the wound. They will fall off in time.   °For Wound Adhesive: °· You may briefly wet your wound in the shower or bath. Do not soak or scrub the wound. Do not swim. Avoid periods of heavy sweating until the skin adhesive has fallen off on its own. After showering or bathing, gently pat the wound dry with a clean towel.   °· Do not apply liquid medicine, cream medicine, ointment medicine, or makeup to your wound while the skin adhesive is in place. This may loosen the film before your wound is healed.   °· If a dressing is placed over the wound, be careful not to apply tape directly over the skin adhesive. This may cause the adhesive to be pulled off before the wound is healed.   °· Avoid   prolonged exposure to sunlight or tanning lamps while the skin adhesive is in place.  The skin adhesive will usually remain in place for 5-10 days, then naturally fall off the skin. Do not pick at the adhesive film.  After Healing: Once the wound has healed, cover the wound with sunscreen during the day for 1 full year. This can help minimize scarring. Exposure to ultraviolet light in the first year will darken the scar. It can take 1-2 years for the scar to lose its redness and to heal completely.  SEEK MEDICAL CARE IF:  You have a fever. SEEK IMMEDIATE  MEDICAL CARE IF:  You have redness, pain, or swelling around the wound.   You see ayellowish-white fluid (pus) coming from the wound.    This information is not intended to replace advice given to you by your health care provider. Make sure you discuss any questions you have with your health care provider.   Document Released: 10/11/2004 Document Revised: 09/24/2014 Document Reviewed: 04/16/2013 Elsevier Interactive Patient Education 2016 [employer]   Hyperglycemia Hyperglycemia occurs when the glucose (sugar) in your blood is too high. Hyperglycemia can happen for many reasons, but it most often happens to people who do not know they have diabetes or are not managing their diabetes properly.  CAUSES  Whether you have diabetes or not, there are other causes of hyperglycemia. Hyperglycemia can occur when you have diabetes, but it can also occur in other situations that you might not be as aware of, such as: Diabetes  If you have diabetes and are having problems controlling your blood glucose, hyperglycemia could occur because of some of the following reasons:  Not following your meal plan.  Not taking your diabetes medications or not taking it properly.  Exercising less or doing less activity than you normally do.  Being sick. Pre-diabetes  This cannot be ignored. Before people develop Type 2 diabetes, they almost always have "pre-diabetes." This is when your blood glucose levels are higher than normal, but not yet high enough to be diagnosed as diabetes. Research has shown that some long-term damage to the body, especially the heart and circulatory system, may already be occurring during pre-diabetes. If you take action to manage your blood glucose when you have pre-diabetes, you may delay or prevent Type 2 diabetes from developing. Stress  If you have diabetes, you may be "diet" controlled or on oral medications or insulin to control your diabetes. However, you may find that  your blood glucose is higher than usual in the hospital whether you have diabetes or not. This is often referred to as "stress hyperglycemia." Stress can elevate your blood glucose. This happens because of hormones put out by the body during times of stress. If stress has been the cause of your high blood glucose, it can be followed regularly by your caregiver. That way he/she can make sure your hyperglycemia does not continue to get worse or progress to diabetes. Steroids  Steroids are medications that act on the infection fighting system (immune system) to block inflammation or infection. One side effect can be a rise in blood glucose. Most people can produce enough extra insulin to allow for this rise, but for those who cannot, steroids make blood glucose levels go even higher. It is not unusual for steroid treatments to "uncover" diabetes that is developing. It is not always possible to determine if the hyperglycemia will go away after the steroids are stopped. A special blood test called an A1c is  sometimes done to determine if your blood glucose was elevated before the steroids were started. SYMPTOMS  Thirsty.  Frequent urination.  Dry mouth.  Blurred vision.  Tired or fatigue.  Weakness.  Sleepy.  Tingling in feet or leg. DIAGNOSIS  Diagnosis is made by monitoring blood glucose in one or all of the following ways:  A1c test. This is a chemical found in your blood.  Fingerstick blood glucose monitoring.  Laboratory results. TREATMENT  First, knowing the cause of the hyperglycemia is important before the hyperglycemia can be treated. Treatment may include, but is not be limited to:  Education.  Change or adjustment in medications.  Change or adjustment in meal plan.  Treatment for an illness, infection, etc.  More frequent blood glucose monitoring.  Change in exercise plan.  Decreasing or stopping steroids.  Lifestyle changes. HOME CARE INSTRUCTIONS   Test your  blood glucose as directed.  Exercise regularly. Your caregiver will give you instructions about exercise. Pre-diabetes or diabetes which comes on with stress is helped by exercising.  Eat wholesome, balanced meals. Eat often and at regular, fixed times. Your caregiver or nutritionist will give you a meal plan to guide your sugar intake.  Being at an ideal weight is important. If needed, losing as little as 10 to 15 pounds may help improve blood glucose levels. SEEK MEDICAL CARE IF:   You have questions about medicine, activity, or diet.  You continue to have symptoms (problems such as increased thirst, urination, or weight gain). SEEK IMMEDIATE MEDICAL CARE IF:   You are vomiting or have diarrhea.  Your breath smells fruity.  You are breathing faster or slower.  You are very sleepy or incoherent.  You have numbness, tingling, or pain in your feet or hands.  You have chest pain.  Your symptoms get worse even though you have been following your caregiver's orders.  If you have any other questions or concerns.   This information is not intended to replace advice given to you by your health care provider. Make sure you discuss any questions you have with your health care provider.   Document Released: 02/27/2001 Document Revised: 11/26/2011 Document Reviewed: 05/10/2015 Elsevier Interactive Patient Education 2016 [employer]  Hyperglycemia Hyperglycemia occurs when the glucose (sugar) in your blood is too high. Hyperglycemia can happen for many reasons, but it most often happens to people who do not know they have diabetes or are not managing their diabetes properly.  CAUSES  Whether you have diabetes or not, there are other causes of hyperglycemia. Hyperglycemia can occur when you have diabetes, but it can also occur in other situations that you might not be as aware of, such as: Diabetes  If you have diabetes and are having problems controlling your blood glucose,  hyperglycemia could occur because of some of the following reasons:  Not following your meal plan.  Not taking your diabetes medications or not taking it properly.  Exercising less or doing less activity than you normally do.  Being sick. Pre-diabetes  This cannot be ignored. Before people develop Type 2 diabetes, they almost always have "pre-diabetes." This is when your blood glucose levels are higher than normal, but not yet high enough to be diagnosed as diabetes. Research has shown that some long-term damage to the body, especially the heart and circulatory system, may already be occurring during pre-diabetes. If you take action to manage your blood glucose when you have pre-diabetes, you may delay or prevent Type 2 diabetes from developing.  Stress  If you have diabetes, you may be "diet" controlled or on oral medications or insulin to control your diabetes. However, you may find that your blood glucose is higher than usual in the hospital whether you have diabetes or not. This is often referred to as "stress hyperglycemia." Stress can elevate your blood glucose. This happens because of hormones put out by the body during times of stress. If stress has been the cause of your high blood glucose, it can be followed regularly by your caregiver. That way he/she can make sure your hyperglycemia does not continue to get worse or progress to diabetes. Steroids  Steroids are medications that act on the infection fighting system (immune system) to block inflammation or infection. One side effect can be a rise in blood glucose. Most people can produce enough extra insulin to allow for this rise, but for those who cannot, steroids make blood glucose levels go even higher. It is not unusual for steroid treatments to "uncover" diabetes that is developing. It is not always possible to determine if the hyperglycemia will go away after the steroids are stopped. A special blood test called an A1c is sometimes  done to determine if your blood glucose was elevated before the steroids were started. SYMPTOMS  Thirsty.  Frequent urination.  Dry mouth.  Blurred vision.  Tired or fatigue.  Weakness.  Sleepy.  Tingling in feet or leg. DIAGNOSIS  Diagnosis is made by monitoring blood glucose in one or all of the following ways:  A1c test. This is a chemical found in your blood.  Fingerstick blood glucose monitoring.  Laboratory results. TREATMENT  First, knowing the cause of the hyperglycemia is important before the hyperglycemia can be treated. Treatment may include, but is not be limited to:  Education.  Change or adjustment in medications.  Change or adjustment in meal plan.  Treatment for an illness, infection, etc.  More frequent blood glucose monitoring.  Change in exercise plan.  Decreasing or stopping steroids.  Lifestyle changes. HOME CARE INSTRUCTIONS   Test your blood glucose as directed.  Exercise regularly. Your caregiver will give you instructions about exercise. Pre-diabetes or diabetes which comes on with stress is helped by exercising.  Eat wholesome, balanced meals. Eat often and at regular, fixed times. Your caregiver or nutritionist will give you a meal plan to guide your sugar intake.  Being at an ideal weight is important. If needed, losing as little as 10 to 15 pounds may help improve blood glucose levels. SEEK MEDICAL CARE IF:   You have questions about medicine, activity, or diet.  You continue to have symptoms (problems such as increased thirst, urination, or weight gain). SEEK IMMEDIATE MEDICAL CARE IF:   You are vomiting or have diarrhea.  Your breath smells fruity.  You are breathing faster or slower.  You are very sleepy or incoherent.  You have numbness, tingling, or pain in your feet or hands.  You have chest pain.  Your symptoms get worse even though you have been following your caregiver's orders.  If you have any other  questions or concerns.   This information is not intended to replace advice given to you by your health care provider. Make sure you discuss any questions you have with your health care provider.   Document Released: 02/27/2001 Document Revised: 11/26/2011 Document Reviewed: 05/10/2015 Elsevier Interactive Patient Education [employer]

## 2016-01-24 NOTE — ED Notes (Signed)
Pt states he needs to leave. Abigail notified
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
## 2016-02-19 ENCOUNTER — Ambulatory Visit (HOSPITAL_COMMUNITY)
Admission: EM | Admit: 2016-02-19 | Discharge: 2016-02-19 | Disposition: A | Discharge status: Home / Self Care | Payer: BLUE CROSS/BLUE SHIELD | Attending: Family Medicine | Admitting: Family Medicine

## 2016-02-19 DIAGNOSIS — R739 Hyperglycemia, unspecified: Secondary | ICD-10-CM

## 2016-02-19 DIAGNOSIS — E119 Type 2 diabetes mellitus without complications: Secondary | ICD-10-CM | POA: Diagnosis not present

## 2016-02-19 LAB — GLUCOSE, CAPILLARY: Glucose-Capillary: 290 mg/dL — ABNORMAL HIGH (ref 65–99)

## 2016-02-19 MED ORDER — METFORMIN HCL 1000 MG PO TABS: 1000.0000 mg | ORAL_TABLET | Freq: Two times a day (BID) | ORAL | Status: DC

## 2016-02-19 NOTE — ED Notes (Signed)
Pt stated that he has an elevated blood sugar due to him being out of his medication(523) 3 hours ago

## 2016-02-19 NOTE — Discharge Instructions (Signed)
We are unable to determine the status of your hydration and kidney function without the blood work today. It is doubtful that you will have decent control of your diabetes with metformin alone. Strongly recommend that you see a primary care doctor as soon as possible. By taking the metformin combined with dehydration and poor sugar control it is possible that you could be causing damage to your kidneys and other organs. Blood Glucose Monitoring, Adult Monitoring your blood glucose (also know as blood sugar) helps you to manage your diabetes. It also helps you and your health care provider monitor your diabetes and determine how well your treatment plan is working. WHY SHOULD YOU MONITOR YOUR BLOOD GLUCOSE?  It can help you understand how food, exercise, and medicine affect your blood glucose.  It allows you to know what your blood glucose is at any given moment. You can quickly tell if you are having low blood glucose (hypoglycemia) or high blood glucose (hyperglycemia).  It can help you and your health care provider know how to adjust your medicines.  It can help you understand how to manage an illness or adjust medicine for exercise. WHEN SHOULD YOU TEST? Your health care provider will help you decide how often you should check your blood glucose. This may depend on the type of diabetes you have, your diabetes control, or the types of medicines you are taking. Be sure to write down all of your blood glucose readings so that this information can be reviewed with your health care provider. See below for examples of testing times that your health care provider may suggest. Type 1 Diabetes  Test at least 2 times per day if your diabetes is well controlled, if you are using an insulin pump, or if you perform multiple daily injections.  If your diabetes is not well controlled or if you are sick, you may need to test more often.  It is a good idea to also test:  Before every insulin  injection.  Before and after exercise.  Between meals and 2 hours after a meal.  Occasionally between 2:00 a.m. and 3:00 a.m. Type 2 Diabetes  If you are taking insulin, test at least 2 times per day. However, it is best to test before every insulin injection.  If you take medicines by mouth (orally), test 2 times a day.  If you are on a controlled diet, test once a day.  If your diabetes is not well controlled or if you are sick, you may need to monitor more often. HOW TO MONITOR YOUR BLOOD GLUCOSE Supplies Needed  Blood glucose meter.  Test strips for your meter. Each meter has its own strips. You must use the strips that go with your own meter.  A pricking needle (lancet).  A device that holds the lancet (lancing device).  A journal or log book to write down your results. Procedure  Wash your hands with soap and water. Alcohol is not preferred.  Prick the side of your finger (not the tip) with the lancet.  Gently milk the finger until a small drop of blood appears.  Follow the instructions that come with your meter for inserting the test strip, applying blood to the strip, and using your blood glucose meter. Other Areas to Get Blood for Testing Some meters allow you to use other areas of your body (other than your finger) to test your blood. These areas are called alternative sites. The most common alternative sites are:  The forearm.  The thigh.  The back area of the lower leg.  The palm of the hand. The blood flow in these areas is slower. Therefore, the blood glucose values you get may be delayed, and the numbers are different from what you would get from your fingers. Do not use alternative sites if you think you are having hypoglycemia. Your reading will not be accurate. Always use a finger if you are having hypoglycemia. Also, if you cannot feel your lows (hypoglycemia unawareness), always use your fingers for your blood glucose checks. ADDITIONAL TIPS FOR  GLUCOSE MONITORING  Do not reuse lancets.  Always carry your supplies with you.  All blood glucose meters have a 24-hour "hotline" number to call if you have questions or need help.  Adjust (calibrate) your blood glucose meter with a control solution after finishing a few boxes of strips. BLOOD GLUCOSE RECORD KEEPING It is a good idea to keep a daily record or log of your blood glucose readings. Most glucose meters, if not all, keep your glucose records stored in the meter. Some meters come with the ability to download your records to your home computer. Keeping a record of your blood glucose readings is especially helpful if you are wanting to look for patterns. Make notes to go along with the blood glucose readings because you might forget what happened at that exact time. Keeping good records helps you and your health care provider to work together to achieve good diabetes management.    This information is not intended to replace advice given to you by your health care provider. Make sure you discuss any questions you have with your health care provider.   Document Released: 09/06/2003 Document Revised: 09/24/2014 Document Reviewed: 01/26/2013 Elsevier Interactive Patient Education 2016 [employer]  Hyperglycemia High blood sugar (hyperglycemia) means that the level of sugar in your blood is higher than it should be. Signs of high blood sugar include:  Feeling thirsty.  Frequent peeing (urinating).  Feeling tired or sleepy.  Dry mouth.  Vision changes.  Feeling weak.  Feeling hungry but losing weight.  Numbness and tingling in your hands or feet.  Headache. When you ignore these signs, your blood sugar may keep going up. These problems may get worse, and other problems may begin. HOME CARE  Check your blood sugars as told by your doctor. Write down the numbers with the date and time.  Take the right amount of insulin or diabetes pills at the right time. Write down the  dose with date and time.  Refill your insulin or diabetes pills before running out.  Watch what you eat. Follow your meal plan.  Drink liquids without sugar, such as water. Check with your doctor if you have kidney or heart disease.  Follow your doctor's orders for exercise. Exercise at the same time of day.  Keep your doctor's appointments. GET HELP RIGHT AWAY IF:   You have trouble thinking or are confused.  You have fast breathing with fruity smelling breath.  You pass out (faint).  You have 2 to 3 days of high blood sugars and you do not know why.  You have chest pain.  You are feeling sick to your stomach (nauseous) or throwing up (vomiting).  You have sudden vision changes. MAKE SURE YOU:   Understand these instructions.  Will watch your condition.  Will get help right away if you are not doing well or get worse.   This information is not intended to replace advice given to  you by your health care provider. Make sure you discuss any questions you have with your health care provider.   Document Released: 07/01/2009 Document Revised: 09/24/2014 Document Reviewed: 05/10/2015 Elsevier Interactive Patient Education [employer]

## 2016-02-19 NOTE — ED Provider Notes (Signed)
CSN: [id number]     Arrival date & time 02/19/16  1933 History   First MD Initiated Contact with Patient 02/19/16 1950     Chief Complaint  Patient presents with  . Hyperglycemia   (Consider location/radiation/quality/duration/timing/severity/associated sxs/prior Treatment) HPI Comments: 45-year-old male with poorly controlled type 2 diabetes mellitus and treated with 1 g twice a day of metformin states he is recently had polyuria and polydipsia. He checked his blood sugar today and it was 500. He does not usually check his blood sugar and he does not follow up with PCP. He also goes to the emergency department for refills on medication. Ordered I-Stat and explained the reason, pt agreed. When time to draw it he refused st I had blood drawn 1 month ago and you can check that.    Past Medical History  Diagnosis Date  . Diabetes mellitus without complication (HCC)   . Methamphetamine abuse    No past surgical history on file. Family History  Problem Relation Age of Onset  . Diabetes Other   . Diabetes Maternal Grandfather    Social History  Substance Use Topics  . Smoking status: Current Every Day Smoker -- 0.50 packs/day    Types: Cigarettes    Last Attempt to Quit: 07/18/2012  . Smokeless tobacco: Not on file  . Alcohol Use: No    Review of Systems  Constitutional: Positive for activity change. Negative for fever.  HENT: Negative.   Respiratory: Negative.   Cardiovascular: Negative.   Gastrointestinal: Negative.   Endocrine: Positive for polydipsia and polyuria.  Musculoskeletal: Negative.   Skin: Negative.   Neurological: Negative.     Allergies  Penicillins  Home Medications   Prior to Admission medications   Medication Sig Start Date End Date Taking? Authorizing Provider  azithromycin (ZITHROMAX) 250 MG tablet Take 1 tablet (250 mg total) by mouth daily. Take first 2 tablets together, then 1 every day until finished. 10/10/15   Dan Floyd, DO  benzonatate  (TESSALON) 100 MG capsule Take 1 capsule (100 mg total) by mouth every 8 (eight) hours. 12/22/15   Jeffrey Hedges, PA-C  cephALEXin (KEFLEX) 500 MG capsule Take 1 capsule (500 mg total) by mouth 4 (four) times daily. 10/10/15   Dan Floyd, DO  clotrimazole (LOTRIMIN) 1 % cream Apply to affected area 2 times daily for at least 4 weeks ( for 1 week until after the lesions have healed) 07/30/15   [name], PA-C  glucose monitoring kit (FREESTYLE) monitoring kit 1 each by Does not apply route 3 (three) times daily. Please dispense glucose monitor, strips and supplies. 11/18/12   Leslie K Sofia, PA-C  levofloxacin (LEVAQUIN) 750 MG tablet Take 1 tablet (750 mg total) by mouth daily. 12/22/15   Jeffrey Hedges, PA-C  meloxicam (MOBIC) 7.5 MG tablet Take 1 tablet (7.5 mg total) by mouth daily. 01/16/15   Julie Idol, PA-C  metFORMIN (GLUCOPHAGE) 1000 MG tablet Take 1 tablet (1,000 mg total) by mouth 2 (two) times daily. 02/19/16   David Mabe, NP  methocarbamol (ROBAXIN-750) 750 MG tablet Take 1 tablet (750 mg total) by mouth 4 (four) times daily. 03/23/13   Anthony Allen, MD  tamsulosin (FLOMAX) 0.4 MG CAPS capsule Take 1 capsule (0.4 mg total) by mouth daily. 08/17/15   Courtney F Horton, MD  traMADol (ULTRAM) 50 MG tablet Take 1 tablet (50 mg total) by mouth every 6 (six) hours as needed. 01/16/15   Julie Idol, PA-C   Meds Ordered and Administered this Visit    Medications - No data to display  BP 126/84 mmHg  Pulse 99  Temp(Src) 99.1 F (37.3 C) (Oral)  Resp 12  SpO2 97% No data found.   Physical Exam  Constitutional: He is oriented to person, place, and time. He appears well-developed and well-nourished. No distress.  Eyes: EOM are normal.  Neck: Normal range of motion. Neck supple.  Cardiovascular: Normal rate.   Pulmonary/Chest: Effort normal. No respiratory distress.  Neurological: He is alert and oriented to person, place, and time. He exhibits normal muscle tone.  Skin: Skin is warm and dry.   Psychiatric: He has a normal mood and affect.  Nursing note and vitals reviewed.   ED Course  Procedures (including critical care time)  Labs Review Labs Reviewed  GLUCOSE, CAPILLARY - Abnormal; Notable for the following:    Glucose-Capillary 290 (*)    All other components within normal limits    Imaging Review No results found.   Visual Acuity Review  Right Eye Distance:   Left Eye Distance:   Bilateral Distance:    Right Eye Near:   Left Eye Near:    Bilateral Near:         MDM   1. Type 2 diabetes mellitus without complication, without long-term current use of insulin (HCC)   2. Hyperglycemia    Meds ordered this encounter  Medications  . metFORMIN (GLUCOPHAGE) 1000 MG tablet    Sig: Take 1 tablet (1,000 mg total) by mouth 2 (two) times daily.    Dispense:  30 tablet    Refill:  0    Order Specific Question:  Supervising Provider    Answer:  KINDL, JAMES D [[id number]]  Pt declined I-stat. 1 mo ago renal fx nl.  BS 290. We are unable to determine the status of your hydration and kidney function without the blood work today. It is doubtful that you will have decent control of your diabetes with metformin alone. Strongly recommend that you see a primary care doctor as soon as possible. By taking the metformin combined with dehydration and poor sugar control it is possible that you could be causing damage to your kidneys and other organs.    David Mabe, NP 02/19/16 2032 

## 2016-08-03 ENCOUNTER — Institutional Professional Consult (permissible substitution): Payer: BLUE CROSS/BLUE SHIELD | Admitting: Pulmonary Disease

## 2016-09-04 ENCOUNTER — Encounter (HOSPITAL_BASED_OUTPATIENT_CLINIC_OR_DEPARTMENT_OTHER): Payer: Self-pay | Admitting: *Deleted

## 2016-09-04 ENCOUNTER — Emergency Department (HOSPITAL_BASED_OUTPATIENT_CLINIC_OR_DEPARTMENT_OTHER)
Admission: EM | Admit: 2016-09-04 | Discharge: 2016-09-04 | Disposition: A | Discharge status: Home / Self Care | Payer: BLUE CROSS/BLUE SHIELD | Attending: Emergency Medicine | Admitting: Emergency Medicine

## 2016-09-04 DIAGNOSIS — Z760 Encounter for issue of repeat prescription: Secondary | ICD-10-CM

## 2016-09-04 DIAGNOSIS — F17210 Nicotine dependence, cigarettes, uncomplicated: Secondary | ICD-10-CM | POA: Insufficient documentation

## 2016-09-04 DIAGNOSIS — E1165 Type 2 diabetes mellitus with hyperglycemia: Secondary | ICD-10-CM | POA: Insufficient documentation

## 2016-09-04 DIAGNOSIS — L84 Corns and callosities: Secondary | ICD-10-CM | POA: Insufficient documentation

## 2016-09-04 DIAGNOSIS — Z7984 Long term (current) use of oral hypoglycemic drugs: Secondary | ICD-10-CM | POA: Insufficient documentation

## 2016-09-04 LAB — CBG MONITORING, ED: Glucose-Capillary: 283 mg/dL — ABNORMAL HIGH (ref 65–99)

## 2016-09-04 MED ORDER — METFORMIN HCL 1000 MG PO TABS: 1000.0000 mg | ORAL_TABLET | Freq: Two times a day (BID) | ORAL | 0 refills | Status: DC

## 2016-09-04 NOTE — ED Provider Notes (Signed)
[facility] Provider Note   CSN: [id number] Arrival date & time: 09/04/16  1234     History   Chief Complaint Chief Complaint  Patient presents with  . Blood Sugar Problem    HPI Dan [name] is a 45 y.o. male with a PMHx of DM2, who presents to the ED requesting medication refill for his metformin that he ran out of 2 days ago. States that he takes metformin 1000 mg twice a day and ran out, reports that his PCP is Dr. Vista Lawman but that he had a lapse in his insurance coverage and hasn't been able to see him recently, his insurance kicks in soon and he will be following up with him again. He complains of polydipsia and polyuria but otherwise he denies any other complaints related to his diabetes. He is also on glipizide 10 mg daily, but states that he has some of that remaining. He also reports that he's had a callus/blister on his right big toe for the last 2 months, denies any red streaking, erythema, warmth, drainage, or pain. Has not tried anything for his symptoms, no known aggravating factors.  He denies lightheadedness, vision changes, fevers, chills, CP, SOB, abd pain, N/V/D/C, hematuria, dysuria, myalgias, arthralgias, numbness, tingling, weakness, or erythema/warmth/drainage/red streaking to big toe. Denies any other complaints at this time.    The history is provided by the patient and medical records. No language interpreter was used.    Past Medical History:  Diagnosis Date  . Diabetes mellitus without complication ([place])   . Methamphetamine abuse     There are no active problems to display for this patient.   History reviewed. No pertinent surgical history.     Home Medications    Prior to Admission medications   Medication Sig Start Date End Date Taking? Authorizing Provider  glipiZIDE (GLUCOTROL XL) 10 MG 24 hr tablet Take 10 mg by mouth daily with breakfast.   Yes Historical Provider, MD  glucose monitoring kit (FREESTYLE) monitoring kit 1  each by Does not apply route 3 (three) times daily. Please dispense glucose monitor, strips and supplies. 11/18/12   [name], PA-C  metFORMIN (GLUCOPHAGE) 1000 MG tablet Take 1 tablet (1,000 mg total) by mouth 2 (two) times daily. 02/19/16   Janne Napoleon, NP    Family History Family History  Problem Relation Age of Onset  . Diabetes Other   . Diabetes Maternal Grandfather     Social History Social History  Substance Use Topics  . Smoking status: Current Every Day Smoker    Packs/day: 0.50    Types: Cigarettes    Last attempt to quit: 07/18/2012  . Smokeless tobacco: Not on file  . Alcohol use No     Allergies   Penicillins   Review of Systems Review of Systems  Constitutional: Negative for chills and fever.  Eyes: Negative for visual disturbance.  Respiratory: Negative for shortness of breath.   Cardiovascular: Negative for chest pain.  Gastrointestinal: Negative for abdominal pain, constipation, diarrhea, nausea and vomiting.  Endocrine: Positive for polydipsia and polyuria.  Genitourinary: Negative for dysuria and hematuria.  Musculoskeletal: Negative for arthralgias and myalgias.  Skin: Positive for wound (callus R big toe). Negative for color change.  Allergic/Immunologic: Negative for immunocompromised state.  Neurological: Negative for weakness, light-headedness and numbness.  Psychiatric/Behavioral: Negative for confusion.   10 Systems reviewed and are negative for acute change except as noted in the HPI.   Physical Exam Updated Vital Signs BP 125/94 (  BP Location: Right Arm)   Pulse 109   Temp 98.4 F (36.9 C) (Oral)   Resp 18   Ht 5' 8.5" (1.74 m)   Wt 77.1 kg   SpO2 98%   BMI 25.47 kg/m   Physical Exam  Constitutional: He is oriented to person, place, and time. Vital signs are normal. He appears well-developed and well-nourished.  Non-toxic appearance. No distress.  Afebrile, nontoxic, NAD, texting on his cell phone throughout the entire exam    HENT:  Head: Normocephalic and atraumatic.  Mouth/Throat: Mucous membranes are normal.  Eyes: Conjunctivae and EOM are normal. Right eye exhibits no discharge. Left eye exhibits no discharge.  Neck: Normal range of motion. Neck supple.  Cardiovascular: Normal rate and intact distal pulses.   Pulmonary/Chest: Effort normal. No respiratory distress.  Abdominal: Normal appearance. He exhibits no distension.  Musculoskeletal: Normal range of motion.  R great toe with FROM intact, with no swelling or erythema, no warmth, no bruising, no crepitus or deformity, with small callus to lateral aspect, hardened skin in this area, but nontender to palpation. No red streaking, no blister or drainage, no evidence of cellulitis or abscess. Strength and sensation grossly intact, distal pulses intact, compartments soft.   Neurological: He is alert and oriented to person, place, and time. He has normal strength. No sensory deficit.  Skin: Skin is warm, dry and intact. No rash noted.  Psychiatric: He has a normal mood and affect.  Nursing note and vitals reviewed.    ED Treatments / Results  Labs (all labs ordered are listed, but only abnormal results are displayed) Labs Reviewed  CBG MONITORING, ED - Abnormal; Notable for the following:       Result Value   Glucose-Capillary 283 (*)    All other components within normal limits    EKG  EKG Interpretation None       Radiology No results found.  Procedures Procedures (including critical care time)  Medications Ordered in ED Medications - No data to display   Initial Impression / Assessment and Plan / ED Course  I have reviewed the triage vital signs and the nursing notes.  Pertinent labs & imaging results that were available during my care of the patient were reviewed by me and considered in my medical decision making (see chart for details).  Clinical Course     45 y.o. male here for med refill of metformin, out x2 days. Polydipsia  and polyuria but otherwise denies complaints. Has callus on R big toe x2 months, unchanged, no s/sx of infection. On exam, NVI with soft compartments in all extremities, callused skin to R big toe laterally, no swelling/warmth/erythema. Discussed use of callus cushion foam pads and pumice stone. [facility], especially since he's diabetic. F/up with PCP in 1-2wks for ongoing management of diabetes and future refills. One time courtesy refill provided but discussed that the ER is not the place for chronic med management and importance of seeing PCP for this. I explained the diagnosis and have given explicit precautions to return to the ER including for any other new or worsening symptoms. The patient understands and accepts the medical plan as it's been dictated and I have answered their questions. Discharge instructions concerning home care and prescriptions have been given. The patient is STABLE and is discharged to home in good condition.   Final Clinical Impressions(s) / ED Diagnoses   Final diagnoses:  Encounter for medication refill  Type 2  diabetes mellitus with hyperglycemia, without long-term current use of insulin (HCC)  Callus of foot    New Prescriptions New Prescriptions   METFORMIN (GLUCOPHAGE) 1000 MG TABLET    Take 1 tablet (1,000 mg total) by mouth 2 (two) times daily.     Rohn Fritsch Camprubi-Soms, PA-C 09/04/16 [place], MD 09/04/16 1529

## 2016-09-04 NOTE — ED Triage Notes (Signed)
Pt out of metformin x 2 days.  Also reports a blister on his toe.

## 2016-09-04 NOTE — Discharge Instructions (Signed)
Take your metformin and glipizide as directed. You have been provided a one-time courtesy refill but you need to see your primary care doctor for ongoing refills and for management of your medical conditions. Stay well hydrated. For your callus, use a pumice stone to smooth the skin out, and use the callus protector foam pads to help protect the area from friction. You need to see a podiatrist for regular foot exams, follow up with the foot center in 1-2 weeks for ongoing foot care. See your primary care doctor in 1-2 weeks for ongoing management of your diabetes and future refills. Return to the ER for emergent changes or worsening symptoms.

## 2016-11-07 ENCOUNTER — Encounter (HOSPITAL_BASED_OUTPATIENT_CLINIC_OR_DEPARTMENT_OTHER): Payer: Self-pay | Admitting: *Deleted

## 2016-11-07 ENCOUNTER — Emergency Department (HOSPITAL_BASED_OUTPATIENT_CLINIC_OR_DEPARTMENT_OTHER)
Admission: EM | Admit: 2016-11-07 | Discharge: 2016-11-07 | Disposition: A | Discharge status: Home / Self Care | Payer: Self-pay | Attending: Emergency Medicine | Admitting: Emergency Medicine

## 2016-11-07 DIAGNOSIS — E1165 Type 2 diabetes mellitus with hyperglycemia: Secondary | ICD-10-CM | POA: Insufficient documentation

## 2016-11-07 DIAGNOSIS — R739 Hyperglycemia, unspecified: Secondary | ICD-10-CM

## 2016-11-07 DIAGNOSIS — F17210 Nicotine dependence, cigarettes, uncomplicated: Secondary | ICD-10-CM | POA: Insufficient documentation

## 2016-11-07 DIAGNOSIS — Z7984 Long term (current) use of oral hypoglycemic drugs: Secondary | ICD-10-CM | POA: Insufficient documentation

## 2016-11-07 LAB — BASIC METABOLIC PANEL
Anion gap: 9 (ref 5–15)
BUN: 12 mg/dL (ref 6–20)
CALCIUM: 9.6 mg/dL (ref 8.9–10.3)
CO2: 25 mmol/L (ref 22–32)
CREATININE: 0.9 mg/dL (ref 0.61–1.24)
Chloride: 99 mmol/L — ABNORMAL LOW (ref 101–111)
GFR calc Af Amer: >60 mL/min (ref >60)
GLUCOSE: 562 mg/dL — AB (ref 65–99)
Potassium: 4.3 mmol/L (ref 3.5–5.1)
SODIUM: 133 mmol/L — AB (ref 135–145)

## 2016-11-07 LAB — I-STAT VENOUS BLOOD GAS, ED
Acid-base deficit: 2 mmol/L (ref 0.0–2.0)
Bicarbonate: 23.1 mmol/L (ref 20.0–28.0)
O2 SAT: 87 %
TCO2: 24 mmol/L (ref 0–100)
pCO2, Ven: 38.7 mmHg — ABNORMAL LOW (ref 44.0–60.0)
pH, Ven: 7.384 (ref 7.250–7.430)
pO2, Ven: 54 mmHg — ABNORMAL HIGH (ref 32.0–45.0)

## 2016-11-07 LAB — URINALYSIS, ROUTINE W REFLEX MICROSCOPIC
BILIRUBIN URINE: NEGATIVE
Glucose, UA: >=500 mg/dL — AB
HGB URINE DIPSTICK: NEGATIVE
Ketones, ur: NEGATIVE mg/dL
Leukocytes, UA: NEGATIVE
Nitrite: NEGATIVE
PH: 6.5 (ref 5.0–8.0)
Protein, ur: NEGATIVE mg/dL
SPECIFIC GRAVITY, URINE: 1.031 — AB (ref 1.005–1.030)

## 2016-11-07 LAB — CBC WITH DIFFERENTIAL/PLATELET
BASOS ABS: 0 10*3/uL (ref 0.0–0.1)
BASOS PCT: 0 %
Eosinophils Absolute: 0.1 10*3/uL (ref 0.0–0.7)
Eosinophils Relative: 1 %
HCT: 43.8 % (ref 39.0–52.0)
Hemoglobin: 14.5 g/dL (ref 13.0–17.0)
LYMPHS PCT: 24 %
Lymphs Abs: 1.7 10*3/uL (ref 0.7–4.0)
MCH: 29.5 pg (ref 26.0–34.0)
MCHC: 33.1 g/dL (ref 30.0–36.0)
MCV: 89.2 fL (ref 78.0–100.0)
MONO ABS: 0.8 10*3/uL (ref 0.1–1.0)
Monocytes Relative: 11 %
Neutro Abs: 4.3 10*3/uL (ref 1.7–7.7)
Neutrophils Relative %: 64 %
Platelets: 164 10*3/uL (ref 150–400)
RBC: 4.91 MIL/uL (ref 4.22–5.81)
RDW: 13.1 % (ref 11.5–15.5)
WBC: 6.8 10*3/uL (ref 4.0–10.5)

## 2016-11-07 LAB — CBG MONITORING, ED
GLUCOSE-CAPILLARY: 459 mg/dL — AB (ref 65–99)
Glucose-Capillary: 296 mg/dL — ABNORMAL HIGH (ref 65–99)
Glucose-Capillary: 287 mg/dL — ABNORMAL HIGH (ref 65–99)

## 2016-11-07 LAB — URINALYSIS, MICROSCOPIC (REFLEX)
Bacteria, UA: NONE SEEN
WBC, UA: NONE SEEN WBC/hpf (ref 0–5)

## 2016-11-07 MED ORDER — SODIUM CHLORIDE 0.9 % IV SOLN
INTRAVENOUS | Status: DC
  Administered 2016-11-07: 13:00:00 via INTRAVENOUS

## 2016-11-07 MED ORDER — SODIUM CHLORIDE 0.9 % IV BOLUS (SEPSIS)
1000.0000 mL | Freq: Once | INTRAVENOUS | Status: AC
  Administered 2016-11-07: 1000 mL via INTRAVENOUS

## 2016-11-07 MED ORDER — METFORMIN HCL 500 MG PO TABS
1000.0000 mg | ORAL_TABLET | Freq: Once | ORAL | Status: AC
  Administered 2016-11-07: 1000 mg via ORAL
  Filled 2016-11-07: qty 2

## 2016-11-07 MED ORDER — METFORMIN HCL 1000 MG PO TABS: 1000.0000 mg | ORAL_TABLET | Freq: Two times a day (BID) | ORAL | 0 refills | Status: DC

## 2016-11-07 MED FILL — metFORMIN HCL 1000 MG TABS: 1000 | 14 days supply | Qty: 28 | Fill #0

## 2016-11-07 NOTE — ED Provider Notes (Signed)
[facility] DEPT [facility] Provider Note   CSN: [id number] Arrival date & time: 11/07/16  4098     History   Chief Complaint Chief Complaint  Patient presents with  . Hyperglycemia    HPI Dan [name] is a 46 y.o. male.  The history is provided by the patient.  Hyperglycemia  Blood sugar level PTA:  "high" Onset quality:  Gradual Duration: unknown. Progression:  Unable to specify Chronicity:  Recurrent Diabetes status:  Controlled with oral medications Current diabetic therapy:  Metformin Context comment:  Has been out of his metformin for several months Relieved by:  None tried Ineffective treatments:  None tried Associated symptoms: increased thirst and polyuria   Associated symptoms: no altered mental status, no chest pain, no diaphoresis, no fatigue, no fever, no increased appetite, no shortness of breath and no syncope    Patient endorsed methamphetamine use.  Past Medical History:  Diagnosis Date  . Diabetes mellitus without complication ([place])   . Methamphetamine abuse     There are no active problems to display for this patient.   History reviewed. No pertinent surgical history.     Home Medications    Prior to Admission medications   Medication Sig Start Date End Date Taking? Authorizing Provider  glipiZIDE (GLUCOTROL XL) 10 MG 24 hr tablet Take 10 mg by mouth daily with breakfast.    Historical Provider, MD  glucose monitoring kit (FREESTYLE) monitoring kit 1 each by Does not apply route 3 (three) times daily. Please dispense glucose monitor, strips and supplies. 11/18/12   [name], PA-C  metFORMIN (GLUCOPHAGE) 1000 MG tablet Take 1 tablet (1,000 mg total) by mouth 2 (two) times daily. 11/07/16 11/21/16  Fatima Blank, MD    Family History Family History  Problem Relation Age of Onset  . Diabetes Other   . Diabetes Maternal Grandfather     Social History Social History  Substance Use Topics  . Smoking status: Current Every Day  Smoker    Packs/day: 0.50    Types: Cigarettes    Last attempt to quit: 07/18/2012  . Smokeless tobacco: Never Used  . Alcohol use No     Allergies   Penicillins   Review of Systems Review of Systems  Constitutional: Negative for diaphoresis, fatigue and fever.  Respiratory: Negative for shortness of breath.   Cardiovascular: Negative for chest pain and syncope.  Endocrine: Positive for polydipsia and polyuria.  Ten systems are reviewed and are negative for acute change except as noted in the HPI    Physical Exam Updated Vital Signs BP 142/87 (BP Location: Left Arm)   Pulse 105   Temp 98.2 F (36.8 C) (Oral)   Resp 18   Ht _0  (1.727 m)   Wt 170 lb (77.1 kg)   SpO2 100%   BMI 25.85 kg/m   Physical Exam  Constitutional: He is oriented to person, place, and time. He appears well-developed and well-nourished. No distress.  HENT:  Head: Normocephalic and atraumatic.  Nose: Nose normal.  Eyes: Conjunctivae and EOM are normal. Pupils are equal, round, and reactive to light. Right eye exhibits no discharge. Left eye exhibits no discharge. No scleral icterus.  Neck: Normal range of motion. Neck supple.  Cardiovascular: Normal rate and regular rhythm.  Exam reveals no gallop and no friction rub.   No murmur heard. Pulmonary/Chest: Effort normal and breath sounds normal. No stridor. No respiratory distress. He has no rales.  Abdominal: Soft. He exhibits no distension. There is  no tenderness.  Musculoskeletal: He exhibits no edema or tenderness.  Neurological: He is alert and oriented to person, place, and time.  Skin: Skin is warm and dry. No rash noted. He is not diaphoretic. No erythema.  Psychiatric: He has a normal mood and affect.  Vitals reviewed.    ED Treatments / Results  Labs (all labs ordered are listed, but only abnormal results are displayed) Labs Reviewed  BASIC METABOLIC PANEL - Abnormal; Notable for the following:       Result Value   Sodium 133 (*)     Chloride 99 (*)    Glucose, Bld 562 (*)    All other components within normal limits  URINALYSIS, ROUTINE W REFLEX MICROSCOPIC - Abnormal; Notable for the following:    Specific Gravity, Urine 1.031 (*)    Glucose, UA >=500 (*)    All other components within normal limits  URINALYSIS, MICROSCOPIC (REFLEX) - Abnormal; Notable for the following:    Squamous Epithelial / LPF 0-5 (*)    All other components within normal limits  CBG MONITORING, ED - Abnormal; Notable for the following:    Glucose-Capillary 459 (*)    All other components within normal limits  I-STAT VENOUS BLOOD GAS, ED - Abnormal; Notable for the following:    pCO2, Ven 38.7 (*)    pO2, Ven 54.0 (*)    All other components within normal limits  CBG MONITORING, ED - Abnormal; Notable for the following:    Glucose-Capillary 296 (*)    All other components within normal limits  CBG MONITORING, ED - Abnormal; Notable for the following:    Glucose-Capillary 287 (*)    All other components within normal limits  CBC WITH DIFFERENTIAL/PLATELET  BLOOD GAS, VENOUS  CBG MONITORING, ED    EKG  EKG Interpretation None       Radiology No results found.  Procedures Procedures (including critical care time)  Medications Ordered in ED Medications  sodium chloride 0.9 % bolus 1,000 mL (0 mLs Intravenous Stopped 11/07/16 1112)    And  sodium chloride 0.9 % bolus 1,000 mL (0 mLs Intravenous Stopped 11/07/16 1231)    And  0.9 %  sodium chloride infusion ( Intravenous Stopped 11/07/16 1335)  metFORMIN (GLUCOPHAGE) tablet 1,000 mg (1,000 mg Oral Given 11/07/16 1115)     Initial Impression / Assessment and Plan / ED Course  I have reviewed the triage vital signs and the nursing notes.  Pertinent labs & imaging results that were available during my care of the patient were reviewed by me and considered in my medical decision making (see chart for details).     Workup without evidence of diabetic ketoacidosis. UA  without evidence of infection. Patient reported difficulty following up with primary care provider due to insurance issues. States that his insurance should kick in shortly. On review of previous records, the patient has had similar presentations for the same and reporting similar difficulty with insurance. Provided with 14 days of courtesy metformin prescription.   The patient is safe for discharge with strict return precautions.  Final Clinical Impressions(s) / ED Diagnoses   Final diagnoses:  Hyperglycemia   Disposition: Discharge  Condition: Good  I have discussed the results, Dx and Tx plan with the patient who expressed understanding and agree(s) with the plan. Discharge instructions discussed at great length. The patient was given strict return precautions who verbalized understanding of the instructions. No further questions at time of discharge.    Discharge Medication List  as of 11/07/2016  1:34 PM      Follow Up: Tresa Garter, MD [place] [address] [phone]   For close follow up to assess for diabetes management      Fatima Blank, MD 11/07/16 1601

## 2016-11-07 NOTE — ED Notes (Signed)
Had just drank some coffee with sweet n low

## 2016-11-07 NOTE — ED Notes (Signed)
ED Provider at bedside answering pt questions.

## 2016-11-07 NOTE — ED Triage Notes (Signed)
Pt reports his fsbs read "high" this morning, states he has been out of his metformin for over a month now. Iv placed while pt being triaged, pt also reports constipation for over a month, had bm yesterday, but reports otherwise he has only had "thin ribbons and jelly beans" for over a month. Pt reports decreased uop also.

## 2017-05-20 ENCOUNTER — Emergency Department (HOSPITAL_COMMUNITY): Payer: Self-pay

## 2017-05-20 ENCOUNTER — Encounter (HOSPITAL_COMMUNITY): Payer: Self-pay | Admitting: Emergency Medicine

## 2017-05-20 ENCOUNTER — Inpatient Hospital Stay (HOSPITAL_COMMUNITY)
Admission: EM | Admit: 2017-05-20 | Discharge: 2017-05-21 | DRG: 918 | Discharge status: Against Medical Advice | Payer: Self-pay | Attending: Family Medicine | Admitting: Family Medicine

## 2017-05-20 DIAGNOSIS — T43601A Poisoning by unspecified psychostimulants, accidental (unintentional), initial encounter: Secondary | ICD-10-CM

## 2017-05-20 DIAGNOSIS — Z833 Family history of diabetes mellitus: Secondary | ICD-10-CM

## 2017-05-20 DIAGNOSIS — Z880 Allergy status to penicillin: Secondary | ICD-10-CM

## 2017-05-20 DIAGNOSIS — F17210 Nicotine dependence, cigarettes, uncomplicated: Secondary | ICD-10-CM | POA: Diagnosis present

## 2017-05-20 DIAGNOSIS — E119 Type 2 diabetes mellitus without complications: Secondary | ICD-10-CM | POA: Diagnosis present

## 2017-05-20 DIAGNOSIS — R410 Disorientation, unspecified: Secondary | ICD-10-CM

## 2017-05-20 DIAGNOSIS — D72829 Elevated white blood cell count, unspecified: Secondary | ICD-10-CM

## 2017-05-20 DIAGNOSIS — T43604A Poisoning by unspecified psychostimulants, undetermined, initial encounter: Secondary | ICD-10-CM

## 2017-05-20 DIAGNOSIS — T43691A Poisoning by other psychostimulants, accidental (unintentional), initial encounter: Principal | ICD-10-CM | POA: Diagnosis present

## 2017-05-20 DIAGNOSIS — R569 Unspecified convulsions: Secondary | ICD-10-CM

## 2017-05-20 DIAGNOSIS — R079 Chest pain, unspecified: Secondary | ICD-10-CM

## 2017-05-20 DIAGNOSIS — Z9114 Patient's other noncompliance with medication regimen: Secondary | ICD-10-CM

## 2017-05-20 LAB — RAPID URINE DRUG SCREEN, HOSP PERFORMED
Amphetamines: POSITIVE — AB
BARBITURATES: NOT DETECTED
Benzodiazepines: POSITIVE — AB
Cocaine: NOT DETECTED
Opiates: NOT DETECTED
TETRAHYDROCANNABINOL: NOT DETECTED

## 2017-05-20 LAB — CBC WITH DIFFERENTIAL/PLATELET
BASOS PCT: 0 %
Basophils Absolute: 0 10*3/uL (ref 0.0–0.1)
Eosinophils Absolute: 0 10*3/uL (ref 0.0–0.7)
Eosinophils Relative: 0 %
HEMATOCRIT: 37.6 % — AB (ref 39.0–52.0)
HEMOGLOBIN: 12.8 g/dL — AB (ref 13.0–17.0)
Lymphocytes Relative: 10 %
Lymphs Abs: 1.6 10*3/uL (ref 0.7–4.0)
MCH: 29.6 pg (ref 26.0–34.0)
MCHC: 34 g/dL (ref 30.0–36.0)
MCV: 87 fL (ref 78.0–100.0)
Monocytes Absolute: 2.3 10*3/uL — ABNORMAL HIGH (ref 0.1–1.0)
Monocytes Relative: 15 %
NEUTROS ABS: 11.6 10*3/uL — AB (ref 1.7–7.7)
NEUTROS PCT: 75 %
Platelets: 130 10*3/uL — ABNORMAL LOW (ref 150–400)
RBC: 4.32 MIL/uL (ref 4.22–5.81)
RDW: 12.9 % (ref 11.5–15.5)
WBC: 15.5 10*3/uL — AB (ref 4.0–10.5)

## 2017-05-20 LAB — HEMOGLOBIN A1C
Hgb A1c MFr Bld: 11.5 % — ABNORMAL HIGH (ref 4.8–5.6)
MEAN PLASMA GLUCOSE: 283.35 mg/dL

## 2017-05-20 LAB — URINALYSIS, ROUTINE W REFLEX MICROSCOPIC
BILIRUBIN URINE: NEGATIVE
Glucose, UA: >=500 mg/dL — AB
KETONES UR: 20 mg/dL — AB
LEUKOCYTES UA: NEGATIVE
Nitrite: NEGATIVE
PH: 5 (ref 5.0–8.0)
Protein, ur: 30 mg/dL — AB
SPECIFIC GRAVITY, URINE: 1.029 (ref 1.005–1.030)

## 2017-05-20 LAB — CBG MONITORING, ED: GLUCOSE-CAPILLARY: 243 mg/dL — AB (ref 65–99)

## 2017-05-20 LAB — COMPREHENSIVE METABOLIC PANEL
ALBUMIN: 3.9 g/dL (ref 3.5–5.0)
ALT: 24 U/L (ref 17–63)
AST: 24 U/L (ref 15–41)
Alkaline Phosphatase: 50 U/L (ref 38–126)
Anion gap: 12 (ref 5–15)
BILIRUBIN TOTAL: 0.6 mg/dL (ref 0.3–1.2)
BUN: 9 mg/dL (ref 6–20)
CALCIUM: 8.5 mg/dL — AB (ref 8.9–10.3)
CO2: 20 mmol/L — AB (ref 22–32)
CREATININE: 0.94 mg/dL (ref 0.61–1.24)
Chloride: 106 mmol/L (ref 101–111)
GFR calc Af Amer: >60 mL/min (ref >60)
GFR calc non Af Amer: >60 mL/min (ref >60)
GLUCOSE: 322 mg/dL — AB (ref 65–99)
Potassium: 3.8 mmol/L (ref 3.5–5.1)
SODIUM: 138 mmol/L (ref 135–145)
TOTAL PROTEIN: 6.8 g/dL (ref 6.5–8.1)

## 2017-05-20 LAB — I-STAT VENOUS BLOOD GAS, ED
Acid-base deficit: 2 mmol/L (ref 0.0–2.0)
BICARBONATE: 21.1 mmol/L (ref 20.0–28.0)
O2 Saturation: 99 %
PCO2 VEN: 30.9 mmHg — AB (ref 44.0–60.0)
TCO2: 22 mmol/L (ref 22–32)
pH, Ven: 7.442 — ABNORMAL HIGH (ref 7.250–7.430)
pO2, Ven: 118 mmHg — ABNORMAL HIGH (ref 32.0–45.0)

## 2017-05-20 LAB — ETHANOL: Alcohol, Ethyl (B): <5 mg/dL (ref <5)

## 2017-05-20 LAB — TROPONIN I: Troponin I: <0.03 ng/mL (ref <0.03)

## 2017-05-20 LAB — CK: CK TOTAL: 419 U/L — AB (ref 49–397)

## 2017-05-20 LAB — I-STAT TROPONIN, ED: TROPONIN I, POC: 0 ng/mL (ref 0.00–0.08)

## 2017-05-20 LAB — MRSA PCR SCREENING: MRSA by PCR: NEGATIVE

## 2017-05-20 LAB — AMMONIA: AMMONIA: 18 umol/L (ref 9–35)

## 2017-05-20 LAB — I-STAT CG4 LACTIC ACID, ED: Lactic Acid, Venous: 1.49 mmol/L (ref 0.5–1.9)

## 2017-05-20 LAB — GLUCOSE, CAPILLARY: Glucose-Capillary: 174 mg/dL — ABNORMAL HIGH (ref 65–99)

## 2017-05-20 LAB — LIPASE, BLOOD: LIPASE: 24 U/L (ref 11–51)

## 2017-05-20 MED ORDER — ONDANSETRON HCL 4 MG/2ML IJ SOLN: 4.0000 mg | Freq: Four times a day (QID) | INTRAMUSCULAR | Status: DC | PRN

## 2017-05-20 MED ORDER — LORAZEPAM 2 MG/ML IJ SOLN
1.0000 mg | Freq: Once | INTRAMUSCULAR | Status: AC
  Administered 2017-05-20: 1 mg via INTRAVENOUS

## 2017-05-20 MED ORDER — ACETAMINOPHEN 650 MG RE SUPP
650.0000 mg | Freq: Four times a day (QID) | RECTAL | Status: DC | PRN
Start: 2017-05-20 — End: 2017-05-21

## 2017-05-20 MED ORDER — ACETAMINOPHEN 325 MG PO TABS: 650.0000 mg | ORAL_TABLET | Freq: Four times a day (QID) | ORAL | Status: DC | PRN

## 2017-05-20 MED ORDER — HYDROCODONE-ACETAMINOPHEN 5-325 MG PO TABS
2.0000 | ORAL_TABLET | Freq: Once | ORAL | Status: AC
  Administered 2017-05-20: 2 via ORAL
  Filled 2017-05-20: qty 2

## 2017-05-20 MED ORDER — SENNOSIDES-DOCUSATE SODIUM 8.6-50 MG PO TABS: 1.0000 | ORAL_TABLET | Freq: Every evening | ORAL | Status: DC | PRN

## 2017-05-20 MED ORDER — SODIUM CHLORIDE 0.9 % IV SOLN
1500.0000 mg | Freq: Once | INTRAVENOUS | Status: DC
  Filled 2017-05-20: qty 15

## 2017-05-20 MED ORDER — ONDANSETRON HCL 4 MG PO TABS: 4.0000 mg | ORAL_TABLET | Freq: Four times a day (QID) | ORAL | Status: DC | PRN

## 2017-05-20 MED ORDER — SODIUM CHLORIDE 0.9 % IV SOLN
INTRAVENOUS | Status: DC
  Administered 2017-05-20: 16:00:00 via INTRAVENOUS

## 2017-05-20 MED ORDER — INSULIN ASPART 100 UNIT/ML ~~LOC~~ SOLN
0.0000 [IU] | Freq: Three times a day (TID) | SUBCUTANEOUS | Status: DC
  Administered 2017-05-20: 3 [IU] via SUBCUTANEOUS
  Filled 2017-05-20: qty 1

## 2017-05-20 MED ORDER — BISACODYL 10 MG RE SUPP: 10.0000 mg | Freq: Every day | RECTAL | Status: DC | PRN

## 2017-05-20 MED ORDER — KETOROLAC TROMETHAMINE 15 MG/ML IJ SOLN
15.0000 mg | Freq: Four times a day (QID) | INTRAMUSCULAR | Status: DC | PRN
  Administered 2017-05-20: 15 mg via INTRAVENOUS
  Filled 2017-05-20: qty 1

## 2017-05-20 MED ORDER — HEPARIN SODIUM (PORCINE) 5000 UNIT/ML IJ SOLN
5000.0000 [IU] | Freq: Three times a day (TID) | INTRAMUSCULAR | Status: DC
  Administered 2017-05-20: 5000 [IU] via SUBCUTANEOUS
  Filled 2017-05-20 (×2): qty 1

## 2017-05-20 MED ORDER — LORAZEPAM 2 MG/ML IJ SOLN
INTRAMUSCULAR | Status: AC
  Filled 2017-05-20: qty 1

## 2017-05-20 NOTE — H&P (Signed)
History and Physical    Dan [name] KGY:[id number] DOB: 1970-12-04 DOA: 05/20/2017   PCP: No primary care provider on file.   Patient coming from:  Home    Chief Complaint: Confusion   HPI: Level V Caveat due to significant confusion and postictal state  Dan [name] is a 46 y.o. male with medical history significant for DM non compliant with meds, meth abuse, brought to the ED with acute confusion. In review, he had called EMS for evaluation of chest pain , but then refused to be transferred to the ED, He called EMS again today for chest pain. On arrival, he was "balled up" on the floor, confused. He appeared to have had a seizure like activity and was given 5 mg Versed . EMS reported he had inhaled bath soap at some point over the last 12 hrs. No other information is able to be obtained at this time. Patient is only oriented to name, and not engaging in conversation, follows very minimal commands   ED Course:  BP 119/72   Pulse (!) 101   Temp (!) 100.5 F (38.1 C) (Rectal)   Resp 20   Wt 74.8 kg (165 lb)   SpO2 100%   BMI 25.09 kg/m    At the ED he had another seizure, for which he was to receive Keppra, but after discussion with Neuro, no Keppra will be initiated at this time   He is postictal . He seems to complain of left leg pain, likely musculoskeletal after seizures.  He is also receiving Toradol injection for pain Zofran IV when necessary Tylenol prn  IVF given  White count 15.5 hemoglobin 12.8 platelets 130 8.5 Glucose 322 Lactic Acid 1.49 Troponin 0 Alcohol negative UDS pending Urine pending CT of the head negative for acute intracranial abnormalities Chest x-ray NAD   Review of Systems: Other ROS unable to be obtained due to Level V Caveat   Past Medical History:  Diagnosis Date  . Diabetes mellitus without complication ([place])   . Methamphetamine abuse     History reviewed. No pertinent surgical history.  Social History Social History   Social History    . Marital status: Single    Spouse name: N/A  . Number of children: N/A  . Years of education: N/A   Occupational History  . Not on file.   Social History Main Topics  . Smoking status: Current Every Day Smoker    Packs/day: 0.50    Types: Cigarettes    Last attempt to quit: 07/18/2012  . Smokeless tobacco: Never Used  . Alcohol use No  . Drug use: Yes    Types: Methamphetamines     Comment: pt reports using bath salts, 05/20/17  . Sexual activity: Not on file   Other Topics Concern  . Not on file   Social History Narrative  . No narrative on file     Allergies  Allergen Reactions  . Penicillins Hives    Family History  Problem Relation Age of Onset  . Diabetes Other   . Diabetes Maternal Grandfather       Prior to Admission medications   Medication Sig Start Date End Date Taking? Authorizing Provider  glipiZIDE (GLUCOTROL XL) 10 MG 24 hr tablet Take 10 mg by mouth daily with breakfast.    [provider]  glucose monitoring kit (FREESTYLE) monitoring kit 1 each by Does not apply route 3 (three) times daily. Please dispense glucose monitor, strips and supplies. 11/18/12  Caryl Ada K, PA-C  metFORMIN (GLUCOPHAGE) 1000 MG tablet Take 1 tablet (1,000 mg total) by mouth 2 (two) times daily. 11/07/16 11/21/16  Fatima Blank, MD    Physical Exam:  Vitals:   05/20/17 1215 05/20/17 1245 05/20/17 1315 05/20/17 1330  BP: 116/73  128/88 119/72  Pulse: (!) 111 (!) 133 96 (!) 101  Resp: (!) 33 (!) _0 Temp:      TempSrc:      SpO2: 100% 100% 97% 100%  Weight:       Constitutional: somewhat agitated, postictal, appears confused Eyes: PERRL, lids and conjunctivae normal ENMT: Mucous membranes are dry, without exudate or lesions  Neck: normal, supple, no masses, no thyromegaly Respiratory: clear to auscultation bilaterally, no wheezing, no crackles. Normal respiratory effort, no accessory muscle use Cardiovascular: mildly tachy  rate and rhythm,   murmur, rubs or gallops. No extremity edema. 2+ pedal pulses. No carotid bruits.  Abdomen: Soft, non tender, No hepatosplenomegaly. Bowel sounds positive.  Musculoskeletal: no clubbing / cyanosis. Moves all extremities, grimaces when moving his lower extremities after his seizure No leg swelling. Left lower rib pain is reproducible.  Skin: no jaundice, No lesions.  Neurologic: Sensation intact  Unable to test strength. Patient appears to move all of his extremities. Knows his name, otherwise, unable to answer any questions        Labs on Admission: I have personally reviewed following labs and imaging studies  CBC:  Recent Labs Lab 05/20/17 1203  WBC 15.5*  NEUTROABS 11.6*  HGB 12.8*  HCT 37.6*  MCV 87.0  PLT 130*    Basic Metabolic Panel:  Recent Labs Lab 05/20/17 1203  NA 138  K 3.8  CL 106  CO2 20*  GLUCOSE 322*  BUN 9  CREATININE 0.94  CALCIUM 8.5*    GFR: CrCl cannot be calculated (Unknown ideal weight.).  Liver Function Tests:  Recent Labs Lab 05/20/17 1203  AST 24  ALT 24  ALKPHOS 50  BILITOT 0.6  PROT 6.8  ALBUMIN 3.9    Recent Labs Lab 05/20/17 1203  LIPASE 24   No results for input(s): AMMONIA in the last 168 hours.  Coagulation Profile: No results for input(s): INR, PROTIME in the last 168 hours.  Cardiac Enzymes: No results for input(s): CKTOTAL, CKMB, CKMBINDEX, TROPONINI in the last 168 hours.  BNP (last 3 results) No results for input(s): PROBNP in the last 8760 hours.  HbA1C: No results for input(s): HGBA1C in the last 72 hours.  CBG: No results for input(s): GLUCAP in the last 168 hours.  Lipid Profile: No results for input(s): CHOL, HDL, LDLCALC, TRIG, CHOLHDL, LDLDIRECT in the last 72 hours.  Thyroid Function Tests: No results for input(s): TSH, T4TOTAL, FREET4, T3FREE, THYROIDAB in the last 72 hours.  Anemia Panel: No results for input(s): VITAMINB12, FOLATE, FERRITIN, TIBC, IRON, RETICCTPCT in the last 72  hours.  Urine analysis:    Component Value Date/Time   COLORURINE YELLOW 11/07/2016 1429   APPEARANCEUR CLEAR 11/07/2016 1429   LABSPEC 1.031 (H) 11/07/2016 1429   PHURINE 6.5 11/07/2016 1429   GLUCOSEU >=500 (A) 11/07/2016 1429   HGBUR NEGATIVE 11/07/2016 1429   BILIRUBINUR NEGATIVE 11/07/2016 1429   KETONESUR NEGATIVE 11/07/2016 1429   PROTEINUR NEGATIVE 11/07/2016 1429   UROBILINOGEN 2.0 (H) 07/30/2015 2258   NITRITE NEGATIVE 11/07/2016 1429   LEUKOCYTESUR NEGATIVE 11/07/2016 1429    Sepsis Labs: _1 (procalcitonin:4,lacticidven:4) )No results found for this or any previous visit (from the past  240 hour(s)).   Radiological Exams on Admission: Ct Head Wo Contrast  Result Date: 05/20/2017 CLINICAL DATA:  Patient found unresponsive and apneic at home earlier today, personal history of methamphetamine abuse, who became alert and combative after Narcan administration. EXAM: CT HEAD WITHOUT CONTRAST TECHNIQUE: Contiguous axial images were obtained from the base of the skull through the vertex without intravenous contrast. COMPARISON:  None. FINDINGS: Motion blurred several images initially but these were repeated and a diagnostic study was obtained. Brain: Ventricular system normal in size and appearance for age. No mass lesion. No midline shift. No acute hemorrhage or hematoma. No extra-axial fluid collections. No evidence of acute infarction. No focal brain parenchymal abnormalities. Vascular: No hyperdense vessel. No visible atherosclerosis. Skull: No skull fracture or other focal osseous abnormality involving the skull. Sinuses/Orbits: Mucosal thickening involving the frontal sinuses and scattered bilateral ethmoid air cells. Mucous retention cyst or polyp in the right maxillary sinus. Left maxillary sinuses, bilateral sphenoid sinuses, bilateral mastoid air cells and bilateral middle ear cavities well-aerated. Orbits and globes intact. Other: None. IMPRESSION: 1. Normal  intracranially. 2. 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 Electronically Signed   By: Evangeline Dakin M.D.   On: 05/20/2017 13:09   Dg Chest Port 1 View  Result Date: 05/20/2017 CLINICAL DATA:  Altered mental status.  Chest pain. EXAM: PORTABLE CHEST 1 VIEW COMPARISON:  12/22/2015. FINDINGS: Normal sized heart. Clear lungs. Mild levoconvex thoracic scoliosis. IMPRESSION: No acute abnormality. Electronically Signed   By: Claudie Revering M.D.   On: 05/20/2017 12:29    EKG: Independently reviewed.  Assessment/Plan Active Problems:   Seizures (HCC)   Confusion   Leukocytosis   Chest pain   Overdose of bath salts   Acute encephalopathy  Concern for seizures (see below) in the setting of bath salts inhalation.No history of seizures  Doubt sepsis ETOH negative. UDS pending. UA pending to r/o infection  Afebrile WBC 15.  Lactic Acid 1.49. CT of the head without acute abnormality     Admit to  SDU/ Inpatient Ammonia  Await UDS results IVF 100 cc/h   CIWA / vit replacement Blood cultures Will hold Keppra for now.  If no improvement, or other etiology is found, will consult Psych   Leukocytosis, likely reactive, related to pain, intoxication, tachycardia and tachypnea,  less likely infection  WBC 15 CXR neg  Afebrile    IVF  Blood culture Await UA results  Repeat CBC in AM Will hold need for antibiotics for now.    Type II Diabetes Current blood sugar level is 322, non compliant with oral agent  Lab Results  Component Value Date   HGBA1C 7.9 (H) 01/28/2013  Hgb A1C Hold home oral diabetic medications.  Lantus , SSI   Chest pain syndrome, cardiac versus musculoskeletal vs anxietyvs bath salt intoxication. TN neg . EKG without acute changes   CXR unrevealing.  Cycle troponins EKG in am  ASA, O2 and NTG as needed Check Lipid panel  Hb A1C 2 D echo  GI cocktail If CP returns or changes in Tn or EKG will obtain Cards consult       DVT prophylaxis:  Heparin  Code Status:    Full code  Family Communication:  None  Disposition Plan: Expect patient to be discharged to home after condition improves Consults called:    None  Admission status: SDU    [facility] E, PA-C [facility]   05/20/2017, 2:29 PM

## 2017-05-20 NOTE — ED Provider Notes (Addendum)
[place] DEPT Provider Note   CSN: [id number] Arrival date & time: 05/20/17  1156     History   Chief Complaint No chief complaint on file.   HPI Dan [name] is a 46 y.o. male.  The history is provided by the EMS personnel. No language interpreter was used.  Altered Mental Status     Dan [name] is a 46 y.o. male who presents to the Emergency Department complaining of chest pain, AMS.  Level 5 caveat due to altered mental status. History is provided by EMS. They state that they were called out to the house last night for chest pain and he refused transfer. Today EMS was called out again for chest pain. On EMS arrival patient was balled up and appeared to have possible seizure-like activity with breath holding. He received 5 mg of Versed IM. He did at one point stated to EMS that he took bath salts. Past Medical History:  Diagnosis Date  . Diabetes mellitus without complication ([place])   . Methamphetamine abuse     There are no active problems to display for this patient.   No past surgical history on file.     Home Medications    Prior to Admission medications   Medication Sig Start Date End Date Taking? Authorizing Provider  glipiZIDE (GLUCOTROL XL) 10 MG 24 hr tablet Take 10 mg by mouth daily with breakfast.    [provider]  glucose monitoring kit (FREESTYLE) monitoring kit 1 each by Does not apply route 3 (three) times daily. Please dispense glucose monitor, strips and supplies. 11/18/12   [name], PA-C  metFORMIN (GLUCOPHAGE) 1000 MG tablet Take 1 tablet (1,000 mg total) by mouth 2 (two) times daily. 11/07/16 11/21/16  Fatima Blank, MD    Family History Family History  Problem Relation Age of Onset  . Diabetes Other   . Diabetes Maternal Grandfather     Social History Social History  Substance Use Topics  . Smoking status: Current Every Day Smoker    Packs/day: 0.50    Types: Cigarettes    Last attempt to quit: 07/18/2012  .  Smokeless tobacco: Never Used  . Alcohol use No     Allergies   Penicillins   Review of Systems Review of Systems  Unable to perform ROS: Mental status change     Physical Exam Updated Vital Signs SpO2 100%   Physical Exam  Constitutional: He appears well-developed and well-nourished.  HENT:  Head: Normocephalic and atraumatic.  Cardiovascular: Regular rhythm.   No murmur heard. Tachycardic  Pulmonary/Chest: Effort normal and breath sounds normal. No respiratory distress.  Abdominal: Soft. There is no rebound and no guarding.  Moderate diffuse abdominal tenderness  Musculoskeletal: He exhibits no edema or tenderness.  Neurological:  Drowsy but arousable to voice. Mumbling speech. Difficulty following commands but does weakly move all 4 extremities  Skin: Skin is warm and dry. Capillary refill takes less than 2 seconds.  Psychiatric:  Unable to assess  Nursing note and vitals reviewed.    ED Treatments / Results  Labs (all labs ordered are listed, but only abnormal results are displayed) Labs Reviewed  COMPREHENSIVE METABOLIC PANEL  LIPASE, BLOOD  CBC WITH DIFFERENTIAL/PLATELET  ETHANOL  URINALYSIS, ROUTINE W REFLEX MICROSCOPIC  RAPID URINE DRUG SCREEN, HOSP PERFORMED  I-STAT TROPONIN, ED  I-STAT CG4 LACTIC ACID, ED  I-STAT VENOUS BLOOD GAS, ED    EKG  EKG Interpretation None       Radiology No  results found.  Procedures Procedures (including critical care time)  Medications Ordered in ED Medications - No data to display   Initial Impression / Assessment and Plan / ED Course  I have reviewed the triage vital signs and the nursing notes.  Pertinent labs & imaging results that were available during my care of the patient were reviewed by me and considered in my medical decision making (see chart for details).     Patient here for evaluation of altered mental status. He did endorse using drugs for EMS. He is confused and cannot give a history  on ED arrival.After ED arrival patient did have an episode of shaking associated with clenching of his fist and drawing up of his arms and legs, concerning for seizure-like activity. He was treated with Ativan and Keppra. Medicine consulted for admission for ongoing treatment of altered mental status, seizures thought to be a drug-induced.  Final Clinical Impressions(s) / ED Diagnoses   Final diagnoses:  None    New Prescriptions New Prescriptions   No medications on file     Quintella Reichert, MD 05/21/17 [place], [place], MD 05/21/17 [phone]

## 2017-05-20 NOTE — ED Notes (Signed)
Pt had witnessed seizure, rolled on side by staff. Madilyn Hookees MD at bedside.

## 2017-05-20 NOTE — ED Triage Notes (Signed)
Pt arrives from home via [facility] s/p witnessed LOC.  EMS reports pt apnic for unknown period, combative on return to consciousness.  EMS reports known drug use. EMS reports giving 5 mL Versed pt alert on arrival, GCS 14, tachycardic.  Dr. Madilyn Hookees at bedside.

## 2017-05-20 NOTE — ED Notes (Signed)
Pt returned to room  

## 2017-05-20 NOTE — ED Notes (Signed)
Attempted to call report x 1  

## 2017-05-21 DIAGNOSIS — Z7984 Long term (current) use of oral hypoglycemic drugs: Secondary | ICD-10-CM | POA: Insufficient documentation

## 2017-05-21 DIAGNOSIS — E119 Type 2 diabetes mellitus without complications: Secondary | ICD-10-CM | POA: Insufficient documentation

## 2017-05-21 DIAGNOSIS — M545 Low back pain: Secondary | ICD-10-CM | POA: Insufficient documentation

## 2017-05-21 DIAGNOSIS — F17210 Nicotine dependence, cigarettes, uncomplicated: Secondary | ICD-10-CM | POA: Insufficient documentation

## 2017-05-21 DIAGNOSIS — N39498 Other specified urinary incontinence: Secondary | ICD-10-CM | POA: Insufficient documentation

## 2017-05-21 DIAGNOSIS — F1920 Other psychoactive substance dependence, uncomplicated: Secondary | ICD-10-CM | POA: Insufficient documentation

## 2017-05-21 LAB — HIV ANTIBODY (ROUTINE TESTING W REFLEX): HIV SCREEN 4TH GENERATION: NONREACTIVE

## 2017-05-21 LAB — GLUCOSE, CAPILLARY: Glucose-Capillary: 328 mg/dL — ABNORMAL HIGH (ref 65–99)

## 2017-05-21 NOTE — Progress Notes (Addendum)
Patient is alert and oriented. Patient said that he "needed to go to his car and to the ATM to give his visitor some money." I explained to patient that leaving the unit was against policy due to cardiac monitoring and for safety reason and at that time that patient is requested to leave AMA. I informed of the risks of leaving the hospital against the advice of his medical teams but he removed equipment and IV and does not wish to anymore medical treatment at this time. TRH made aware. Patient has signed AMA form and has left the unit.

## 2017-05-22 ENCOUNTER — Emergency Department (HOSPITAL_BASED_OUTPATIENT_CLINIC_OR_DEPARTMENT_OTHER): Payer: Self-pay

## 2017-05-22 ENCOUNTER — Encounter (HOSPITAL_BASED_OUTPATIENT_CLINIC_OR_DEPARTMENT_OTHER): Payer: Self-pay | Admitting: *Deleted

## 2017-05-22 ENCOUNTER — Emergency Department (HOSPITAL_BASED_OUTPATIENT_CLINIC_OR_DEPARTMENT_OTHER)
Admission: EM | Admit: 2017-05-22 | Discharge: 2017-05-22 | Disposition: A | Discharge status: Home / Self Care | Payer: Self-pay | Attending: Emergency Medicine | Admitting: Emergency Medicine

## 2017-05-22 DIAGNOSIS — G8929 Other chronic pain: Secondary | ICD-10-CM

## 2017-05-22 DIAGNOSIS — R739 Hyperglycemia, unspecified: Secondary | ICD-10-CM

## 2017-05-22 DIAGNOSIS — F1990 Other psychoactive substance use, unspecified, uncomplicated: Secondary | ICD-10-CM

## 2017-05-22 DIAGNOSIS — N39498 Other specified urinary incontinence: Secondary | ICD-10-CM

## 2017-05-22 DIAGNOSIS — M545 Low back pain, unspecified: Secondary | ICD-10-CM

## 2017-05-22 LAB — URINALYSIS, ROUTINE W REFLEX MICROSCOPIC
Bilirubin Urine: NEGATIVE
HGB URINE DIPSTICK: NEGATIVE
Ketones, ur: 15 mg/dL — AB
LEUKOCYTES UA: NEGATIVE
Nitrite: NEGATIVE
PH: 6 (ref 5.0–8.0)
PROTEIN: NEGATIVE mg/dL
Specific Gravity, Urine: <1.005 — ABNORMAL LOW (ref 1.005–1.030)

## 2017-05-22 LAB — BLOOD CULTURE ID PANEL (REFLEXED)
ACINETOBACTER BAUMANNII: NOT DETECTED
CARBAPENEM RESISTANCE: NOT DETECTED
Candida albicans: NOT DETECTED
Candida glabrata: NOT DETECTED
Candida krusei: NOT DETECTED
Candida parapsilosis: NOT DETECTED
Candida tropicalis: NOT DETECTED
ENTEROBACTERIACEAE SPECIES: NOT DETECTED
ENTEROCOCCUS SPECIES: NOT DETECTED
Enterobacter cloacae complex: NOT DETECTED
Escherichia coli: NOT DETECTED
HAEMOPHILUS INFLUENZAE: NOT DETECTED
KLEBSIELLA OXYTOCA: NOT DETECTED
Klebsiella pneumoniae: NOT DETECTED
LISTERIA MONOCYTOGENES: NOT DETECTED
METHICILLIN RESISTANCE: NOT DETECTED
NEISSERIA MENINGITIDIS: NOT DETECTED
PSEUDOMONAS AERUGINOSA: NOT DETECTED
Proteus species: NOT DETECTED
SERRATIA MARCESCENS: NOT DETECTED
STAPHYLOCOCCUS AUREUS BCID: NOT DETECTED
STAPHYLOCOCCUS SPECIES: NOT DETECTED
STREPTOCOCCUS AGALACTIAE: NOT DETECTED
STREPTOCOCCUS PYOGENES: NOT DETECTED
STREPTOCOCCUS SPECIES: NOT DETECTED
Streptococcus pneumoniae: NOT DETECTED
VANCOMYCIN RESISTANCE: NOT DETECTED

## 2017-05-22 LAB — CBC WITH DIFFERENTIAL/PLATELET
Basophils Absolute: 0 10*3/uL (ref 0.0–0.1)
Basophils Relative: 0 %
EOS PCT: 0 %
Eosinophils Absolute: 0 10*3/uL (ref 0.0–0.7)
HEMATOCRIT: 37.2 % — AB (ref 39.0–52.0)
Hemoglobin: 12.4 g/dL — ABNORMAL LOW (ref 13.0–17.0)
LYMPHS ABS: 1.4 10*3/uL (ref 0.7–4.0)
LYMPHS PCT: 14 %
MCH: 29.2 pg (ref 26.0–34.0)
MCHC: 33.3 g/dL (ref 30.0–36.0)
MCV: 87.7 fL (ref 78.0–100.0)
Monocytes Absolute: 0.9 10*3/uL (ref 0.1–1.0)
Monocytes Relative: 9 %
NEUTROS ABS: 7.3 10*3/uL (ref 1.7–7.7)
Neutrophils Relative %: 77 %
PLATELETS: 123 10*3/uL — AB (ref 150–400)
RBC: 4.24 MIL/uL (ref 4.22–5.81)
RDW: 12.6 % (ref 11.5–15.5)
WBC: 9.7 10*3/uL (ref 4.0–10.5)

## 2017-05-22 LAB — BASIC METABOLIC PANEL
Anion gap: 8 (ref 5–15)
BUN: 11 mg/dL (ref 6–20)
CO2: 24 mmol/L (ref 22–32)
Calcium: 8.4 mg/dL — ABNORMAL LOW (ref 8.9–10.3)
Chloride: 104 mmol/L (ref 101–111)
Creatinine, Ser: 0.78 mg/dL (ref 0.61–1.24)
GFR calc Af Amer: >60 mL/min (ref >60)
Glucose, Bld: 401 mg/dL — ABNORMAL HIGH (ref 65–99)
POTASSIUM: 3.5 mmol/L (ref 3.5–5.1)
Sodium: 136 mmol/L (ref 135–145)

## 2017-05-22 LAB — URINALYSIS, MICROSCOPIC (REFLEX)
BACTERIA UA: NONE SEEN
SQUAMOUS EPITHELIAL / LPF: NONE SEEN
WBC, UA: NONE SEEN WBC/hpf (ref 0–5)

## 2017-05-22 MED ORDER — SODIUM CHLORIDE 0.9 % IV BOLUS (SEPSIS)
1000.0000 mL | Freq: Once | INTRAVENOUS | Status: AC
  Administered 2017-05-22: 1000 mL via INTRAVENOUS

## 2017-05-22 MED ORDER — AMMONIA AROMATIC IN INHA
RESPIRATORY_TRACT | Status: AC
  Administered 2017-05-22: 06:00:00
  Filled 2017-05-22: qty 10

## 2017-05-22 MED ORDER — MORPHINE SULFATE (PF) 4 MG/ML IV SOLN
4.0000 mg | Freq: Once | INTRAVENOUS | Status: AC
  Administered 2017-05-22: 4 mg via INTRAVENOUS
  Filled 2017-05-22: qty 1

## 2017-05-22 NOTE — ED Notes (Signed)
While in waiting room pt became "unresponsive" and urinated in the floor pt VS were WNL and became responsive to painful and noxious stimuli as soon as noxious stimuli removed pt again became "unresponsive" VS remained stable and he was brought back to room

## 2017-05-22 NOTE — ED Provider Notes (Signed)
[facility] DEPT [facility] Provider Note   CSN: [id number] Arrival date & time: 05/21/17  2359     History   Chief Complaint Chief Complaint  Patient presents with  . Back Pain    HPI Dan [name] is a 46 y.o. male.  HPI  This a 46 year old male with history of diabetes and IV drug abuse who presents with back pain. Patient reports greater than 1 week history of worsening lower back pain. He states that he was in jail and was "[place] tied" by police. Since that time he has had worsening lower back pain. He reports difficulty ambulating. He has had 2 episodes of urinary incontinence today including while in the waiting room. He denies any fevers. Long-standing history of IV drug use which she continues. Of note, he was at [facility] several days ago for seizure-like activity. He left AGAINST MEDICAL ADVICE. He states that he told them that he was having back pain but there is no note of that and the chart. Patient reports difficulty ambulating and requiring assistance to his car earlier this evening.  Past Medical History:  Diagnosis Date  . Diabetes mellitus without complication ([place])   . Methamphetamine abuse     Patient Active Problem List   Diagnosis Date Noted  . Seizures ([place]) 05/20/2017  . Confusion 05/20/2017  . Leukocytosis 05/20/2017  . Chest pain 05/20/2017  . Overdose of bath salts 05/20/2017    History reviewed. No pertinent surgical history.     Home Medications    Prior to Admission medications   Medication Sig Start Date End Date Taking? Authorizing Provider  glipiZIDE (GLUCOTROL XL) 10 MG 24 hr tablet Take 10 mg by mouth daily with breakfast.    [provider]  glucose monitoring kit (FREESTYLE) monitoring kit 1 each by Does not apply route 3 (three) times daily. Please dispense glucose monitor, strips and supplies. 11/18/12   [name], PA-C  metFORMIN (GLUCOPHAGE) 1000 MG tablet Take 1 tablet (1,000 mg total) by mouth 2 (two) times daily.  11/07/16 11/21/16  Fatima Blank, MD    Family History Family History  Problem Relation Age of Onset  . Diabetes Other   . Diabetes Maternal Grandfather     Social History Social History  Substance Use Topics  . Smoking status: Current Every Day Smoker    Packs/day: 0.50    Types: Cigarettes    Last attempt to quit: 07/18/2012  . Smokeless tobacco: Never Used  . Alcohol use No     Allergies   Penicillins   Review of Systems Review of Systems  Constitutional: Negative for fever.  Respiratory: Negative for shortness of breath.   Cardiovascular: Negative for chest pain.  Gastrointestinal: Negative for abdominal pain, nausea and vomiting.  Genitourinary:       Urinary incontinence  Musculoskeletal: Positive for back pain.  All other systems reviewed and are negative.    Physical Exam Updated Vital Signs BP 125/81 (BP Location: Right Arm)   Pulse 93   Temp 98.5 F (36.9 C) (Oral)   Resp 20   Ht _0  (1.727 m)   Wt 86.2 kg (190 lb)   SpO2 100%   BMI 28.89 kg/m   Physical Exam  Constitutional: He is oriented to person, place, and time. No distress.  Lying prone on the bed  HENT:  Head: Normocephalic and atraumatic.  Cardiovascular: Normal rate, regular rhythm and normal heart sounds.   No murmur heard. Pulmonary/Chest: Effort normal and breath  sounds normal. No respiratory distress. He has no wheezes.  Abdominal: Soft. Bowel sounds are normal. There is no tenderness. There is no rebound.  Genitourinary:  Genitourinary Comments: Normal rectal tone, patient able to contract rectal sphincter, no anesthesia  Musculoskeletal:  Tenderness palpation midline lower lumbar spine, no step-off or deformities noted  Neurological: He is alert and oriented to person, place, and time.  Cranial nerves II through XII intact, 4+ out of 5 hip flexion, knee extension, plantar and dorsiflexion bilaterally, no clonus  Skin: Skin is warm and dry.  Psychiatric: He has a  normal mood and affect.  Nursing note and vitals reviewed.    ED Treatments / Results  Labs (all labs ordered are listed, but only abnormal results are displayed) Labs Reviewed  CBC WITH DIFFERENTIAL/PLATELET - Abnormal; Notable for the following:       Result Value   Hemoglobin 12.4 (*)    HCT 37.2 (*)    Platelets 123 (*)    All other components within normal limits  BASIC METABOLIC PANEL - Abnormal; Notable for the following:    Glucose, Bld 401 (*)    Calcium 8.4 (*)    All other components within normal limits  URINALYSIS, ROUTINE W REFLEX MICROSCOPIC - Abnormal; Notable for the following:    Specific Gravity, Urine <1.005 (*)    Glucose, UA >=500 (*)    Ketones, ur 15 (*)    All other components within normal limits  URINALYSIS, MICROSCOPIC (REFLEX)    EKG  EKG Interpretation None       Radiology Ct Head Wo Contrast  Result Date: 05/20/2017 CLINICAL DATA:  Patient found unresponsive and apneic at home earlier today, personal history of methamphetamine abuse, who became alert and combative after Narcan administration. EXAM: CT HEAD WITHOUT CONTRAST TECHNIQUE: Contiguous axial images were obtained from the base of the skull through the vertex without intravenous contrast. COMPARISON:  None. FINDINGS: Motion blurred several images initially but these were repeated and a diagnostic study was obtained. Brain: Ventricular system normal in size and appearance for age. No mass lesion. No midline shift. No acute hemorrhage or hematoma. No extra-axial fluid collections. No evidence of acute infarction. No focal brain parenchymal abnormalities. Vascular: No hyperdense vessel. No visible atherosclerosis. Skull: No skull fracture or other focal osseous abnormality involving the skull. Sinuses/Orbits: Mucosal thickening involving the frontal sinuses and scattered bilateral ethmoid air cells. Mucous retention cyst or polyp in the right maxillary sinus. Left maxillary sinuses, bilateral  sphenoid sinuses, bilateral mastoid air cells and bilateral middle ear cavities well-aerated. Orbits and globes intact. Other: None. IMPRESSION: 1. Normal intracranially. 2. 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 Electronically Signed   By: Evangeline Dakin M.D.   On: 05/20/2017 13:09   Ct Lumbar Spine Wo Contrast  Result Date: 05/22/2017 CLINICAL DATA:  46 y/o  M; severe back pain. EXAM: CT LUMBAR SPINE WITHOUT CONTRAST TECHNIQUE: Multidetector CT imaging of the lumbar spine was performed without intravenous contrast administration. Multiplanar CT image reconstructions were also generated. COMPARISON:  None. FINDINGS: Segmentation: 5 lumbar type vertebrae. Alignment: Straightening of lumbar lordosis without listhesis. Vertebrae: Chronic bilateral L5 pars defects. No acute fracture identified. Paraspinal and other soft tissues: Negative. Disc levels: Moderate loss of the L5-S1 intervertebral disc space with sclerosis and eburnation of the opposing endplates compatible with erosive osteoarthritis. Mild loss of the L3-4 intervertebral disc space. No significant bony foraminal or canal stenosis. Moderate disc bulge at the L5-S1 level. IMPRESSION:  1. No acute fracture or dislocation. 2. Straightening of lumbar lordosis.  No listhesis. 3. Chronic L5 pars defects. 4. Mild L3-4 and moderate L5-S1 loss of intervertebral disc space height. L5-S1 endplate sclerosis and eburnation compatible with sequelae of erosive osteoarthritis. Moderate L5-S1 disc bulge. Electronically Signed   By: Kristine Garbe M.D.   On: 05/22/2017 02:49   Dg Chest Port 1 View  Result Date: 05/20/2017 CLINICAL DATA:  Altered mental status.  Chest pain. EXAM: PORTABLE CHEST 1 VIEW COMPARISON:  12/22/2015. FINDINGS: Normal sized heart. Clear lungs. Mild levoconvex thoracic scoliosis. IMPRESSION: No acute abnormality. Electronically Signed   By: Claudie Revering M.D.   On: 05/20/2017 12:29     Procedures Procedures (including critical care time)  Medications Ordered in ED Medications  ammonia inhalant (not administered)  sodium chloride 0.9 % bolus 1,000 mL (not administered)  morphine 4 MG/ML injection 4 mg (not administered)     Initial Impression / Assessment and Plan / ED Course  I have reviewed the triage vital signs and the nursing notes.  Pertinent labs & imaging results that were available during my care of the patient were reviewed by me and considered in my medical decision making (see chart for details).  Clinical Course as of May 22 434  Wed May 22, 2017  1191 CT Lumbar Spine Wo Contrast [CH]    Clinical Course User Index [CH] Horton, Barbette Hair, MD    Patient presents with back pain. Reports injury over one week ago. Reports difficulty ambulating and urinary incontinence. He is an IV drug user as well. He is overall nontoxic appearing. He did urinate all over his jeans.  Question effort on neurologic exam he does have symmetric preserved strength at 4+ out of 5 in the bilateral lower extremity, normal rectal tone, reflexes preserved.  Notably hyperglycemic. No anion gap. Patient was given fluids and pain medication. CT scan of the spine is without acute fracture. Question erosive arthritis. Patient has multiple red flags for back pain including IV drug use and urinary incontinence. I do question his exam but feel that it is pertinent for him to get an MRI to rule out acute cauda equina. This was discussed with Dr. Billy Fischer.  Patient will be transferred ER to ER for further evaluation.  Final Clinical Impressions(s) / ED Diagnoses   Final diagnoses:  Acute midline low back pain without sciatica  IVDU (intravenous drug user)  Other urinary incontinence    New Prescriptions New Prescriptions   No medications on file     [name], MD 05/22/17 [phone]

## 2017-05-22 NOTE — ED Triage Notes (Signed)
Pt was admitted to [facility] x 1 day ago Left AMA , today c/o "severe back pain "

## 2017-05-22 NOTE — ED Notes (Signed)
Pos gram variable rods  Per dr little pt needs to return to hospital right away   Pt called  Did not answer phone left message to call [facility] asap   2340   05/22/17  Sam Yordin Rhoda rn

## 2017-05-22 NOTE — ED Provider Notes (Signed)
Mr. Lowell Guitarowell was transferred here from his whole purpose of having an MRI for back pain. He has a history of IV drug abuse and concern was for possible epidural infection. He has chronic back pain.  I was informed by nurses that he is refusing his MRI. I have evaluated him in the room. I've examined him. He is sitting on the edge that he is able to stand. He is able to dress himself. He declines specific neurological evaluation of his arms and legs in any detail fashion. He is able to express to me that he understands that by refusing the may have an acute neurological condition that could deteriorate or result in loss of function or death or permanent disability.  He states very clearly to me "I understand all of that, I ain't blamin' nobody".  Discussed with him at length that I would help him with any anxiety associated with his MRI. I've offered Ativan, and pain control. I offered consultation with anesthesia for the possibility of conscious sedation or general anesthesia for his MRI. He continues to decline. He is oriented 3. He does not have encephalopathy or acute alteration of consciousness or intoxication that would preclude him making this decision.   Rolland PorterJames, Luisangel Wainright, MD 05/22/17 [phone]

## 2017-05-22 NOTE — ED Notes (Signed)
Patient transported to CT 

## 2017-05-22 NOTE — ED Notes (Signed)
ED Provider at bedside. 

## 2017-05-22 NOTE — ED Notes (Signed)
Pt transferred from [facility] for MRI due to questionable "passing out"and incontinence. Pt states he is having 10 of 10 back pain, while lying comfortably in bed. Report from [place], RN with [facility].  VSS. A & O x 4. Pt unsure of why he is here, and is very quick to change his story and complaint. Pt states that he didn't fall and wasn't at a different hospital, and next he says, "Oh yeah, I was at [facility]".

## 2017-05-22 NOTE — ED Notes (Signed)
[facility] Benna Dunks(Taryn)  notified - [facility]

## 2017-05-22 NOTE — ED Notes (Addendum)
Spoke with pt RE going to MRI.  He was aware of what an MRI was and stated he had had one before.  When MRI arrived, pt first stated he had to have a bowel movement.  Staff began to assist pt to wheelchair, then he stated he did not have to have a bm and that he was not going to MRI.  MD  Notified and explained to pt that if he did not have an MRI he would be discharged.  Pt chose to be discharged.  Security was called d/t pt refusing to have door closed while he was putting on his clothes.  It was explained to him that he could not sit in front of an open door naked.  Pt wheeled out in wheelchair by security and placed in car.

## 2017-05-22 NOTE — Discharge Instructions (Signed)
I am refusing to have the MRI today. Dr. Fayrene FearingJames and Dr. Wilkie AyeHorton have explained to me that I may have a condition with my spine or infection that could result in paralysis or permanent disability if not detected and treated.

## 2017-05-23 ENCOUNTER — Telehealth (HOSPITAL_BASED_OUTPATIENT_CLINIC_OR_DEPARTMENT_OTHER): Payer: Self-pay | Admitting: *Deleted

## 2017-05-23 ENCOUNTER — Emergency Department (HOSPITAL_COMMUNITY): Payer: Self-pay

## 2017-05-23 ENCOUNTER — Encounter (HOSPITAL_COMMUNITY): Payer: Self-pay | Admitting: *Deleted

## 2017-05-23 ENCOUNTER — Inpatient Hospital Stay (HOSPITAL_COMMUNITY)
Admission: EM | Admit: 2017-05-23 | Discharge: 2017-05-31 | DRG: 540 | Disposition: A | Discharge status: Home Health | Payer: Self-pay | Attending: Family Medicine | Admitting: Family Medicine

## 2017-05-23 DIAGNOSIS — M4627 Osteomyelitis of vertebra, lumbosacral region: Principal | ICD-10-CM | POA: Diagnosis present

## 2017-05-23 DIAGNOSIS — B9689 Other specified bacterial agents as the cause of diseases classified elsewhere: Secondary | ICD-10-CM | POA: Diagnosis present

## 2017-05-23 DIAGNOSIS — Z880 Allergy status to penicillin: Secondary | ICD-10-CM

## 2017-05-23 DIAGNOSIS — E119 Type 2 diabetes mellitus without complications: Secondary | ICD-10-CM | POA: Diagnosis present

## 2017-05-23 DIAGNOSIS — D649 Anemia, unspecified: Secondary | ICD-10-CM | POA: Diagnosis present

## 2017-05-23 DIAGNOSIS — B952 Enterococcus as the cause of diseases classified elsewhere: Secondary | ICD-10-CM | POA: Diagnosis present

## 2017-05-23 DIAGNOSIS — F1510 Other stimulant abuse, uncomplicated: Secondary | ICD-10-CM | POA: Diagnosis present

## 2017-05-23 DIAGNOSIS — Z9114 Patient's other noncompliance with medication regimen: Secondary | ICD-10-CM

## 2017-05-23 DIAGNOSIS — F1990 Other psychoactive substance use, unspecified, uncomplicated: Secondary | ICD-10-CM | POA: Diagnosis present

## 2017-05-23 DIAGNOSIS — R7881 Bacteremia: Secondary | ICD-10-CM | POA: Diagnosis present

## 2017-05-23 DIAGNOSIS — R569 Unspecified convulsions: Secondary | ICD-10-CM | POA: Diagnosis present

## 2017-05-23 DIAGNOSIS — Z720 Tobacco use: Secondary | ICD-10-CM

## 2017-05-23 DIAGNOSIS — M549 Dorsalgia, unspecified: Secondary | ICD-10-CM | POA: Diagnosis present

## 2017-05-23 DIAGNOSIS — Z833 Family history of diabetes mellitus: Secondary | ICD-10-CM

## 2017-05-23 DIAGNOSIS — M4647 Discitis, unspecified, lumbosacral region: Secondary | ICD-10-CM | POA: Diagnosis present

## 2017-05-23 DIAGNOSIS — F17210 Nicotine dependence, cigarettes, uncomplicated: Secondary | ICD-10-CM | POA: Diagnosis present

## 2017-05-23 DIAGNOSIS — M4646 Discitis, unspecified, lumbar region: Secondary | ICD-10-CM

## 2017-05-23 DIAGNOSIS — Z91030 Bee allergy status: Secondary | ICD-10-CM

## 2017-05-23 DIAGNOSIS — R32 Unspecified urinary incontinence: Secondary | ICD-10-CM | POA: Diagnosis present

## 2017-05-23 DIAGNOSIS — E876 Hypokalemia: Secondary | ICD-10-CM | POA: Diagnosis not present

## 2017-05-23 HISTORY — DX: Other psychoactive substance use, unspecified, uncomplicated: F19.90

## 2017-05-23 HISTORY — DX: Tobacco use: Z72.0

## 2017-05-23 LAB — COMPREHENSIVE METABOLIC PANEL
ALBUMIN: 3.1 g/dL — AB (ref 3.5–5.0)
ALT: 15 U/L — AB (ref 17–63)
AST: 20 U/L (ref 15–41)
Alkaline Phosphatase: 65 U/L (ref 38–126)
Anion gap: 10 (ref 5–15)
BUN: 15 mg/dL (ref 6–20)
CHLORIDE: 103 mmol/L (ref 101–111)
CO2: 23 mmol/L (ref 22–32)
CREATININE: 0.73 mg/dL (ref 0.61–1.24)
Calcium: 8.6 mg/dL — ABNORMAL LOW (ref 8.9–10.3)
GFR calc Af Amer: >60 mL/min (ref >60)
GFR calc non Af Amer: >60 mL/min (ref >60)
Glucose, Bld: 477 mg/dL — ABNORMAL HIGH (ref 65–99)
POTASSIUM: 3.9 mmol/L (ref 3.5–5.1)
SODIUM: 136 mmol/L (ref 135–145)
Total Bilirubin: 0.8 mg/dL (ref 0.3–1.2)
Total Protein: 6.4 g/dL — ABNORMAL LOW (ref 6.5–8.1)

## 2017-05-23 LAB — CBC WITH DIFFERENTIAL/PLATELET
Basophils Absolute: 0 10*3/uL (ref 0.0–0.1)
Basophils Relative: 0 %
EOS PCT: 1 %
Eosinophils Absolute: 0.1 10*3/uL (ref 0.0–0.7)
HEMATOCRIT: 37.7 % — AB (ref 39.0–52.0)
Hemoglobin: 12.6 g/dL — ABNORMAL LOW (ref 13.0–17.0)
LYMPHS ABS: 0.9 10*3/uL (ref 0.7–4.0)
LYMPHS PCT: 8 %
MCH: 28.9 pg (ref 26.0–34.0)
MCHC: 33.4 g/dL (ref 30.0–36.0)
MCV: 86.5 fL (ref 78.0–100.0)
MONO ABS: 0.8 10*3/uL (ref 0.1–1.0)
Monocytes Relative: 7 %
NEUTROS ABS: 9.6 10*3/uL — AB (ref 1.7–7.7)
Neutrophils Relative %: 84 %
PLATELETS: 141 10*3/uL — AB (ref 150–400)
RBC: 4.36 MIL/uL (ref 4.22–5.81)
RDW: 12.7 % (ref 11.5–15.5)
WBC: 11.3 10*3/uL — ABNORMAL HIGH (ref 4.0–10.5)

## 2017-05-23 LAB — I-STAT CG4 LACTIC ACID, ED
LACTIC ACID, VENOUS: 1.32 mmol/L (ref 0.5–1.9)
Lactic Acid, Venous: 1.34 mmol/L (ref 0.5–1.9)

## 2017-05-23 LAB — URINALYSIS, ROUTINE W REFLEX MICROSCOPIC
BACTERIA UA: NONE SEEN
Bilirubin Urine: NEGATIVE
HGB URINE DIPSTICK: NEGATIVE
Ketones, ur: 5 mg/dL — AB
Leukocytes, UA: NEGATIVE
Nitrite: NEGATIVE
Protein, ur: NEGATIVE mg/dL
SPECIFIC GRAVITY, URINE: 1.035 — AB (ref 1.005–1.030)
Squamous Epithelial / LPF: NONE SEEN
pH: 5 (ref 5.0–8.0)

## 2017-05-23 LAB — PROTIME-INR
INR: 0.92
Prothrombin Time: 12.3 seconds (ref 11.4–15.2)

## 2017-05-23 LAB — CBG MONITORING, ED: Glucose-Capillary: 433 mg/dL — ABNORMAL HIGH (ref 65–99)

## 2017-05-23 MED ORDER — HYDROCODONE-ACETAMINOPHEN 5-325 MG PO TABS
1.0000 | ORAL_TABLET | Freq: Once | ORAL | Status: AC
  Administered 2017-05-23: 1 via ORAL
  Filled 2017-05-23: qty 1

## 2017-05-23 NOTE — ED Notes (Signed)
The pt reports that he was  At [facility] last pm.  He had blood drawn and  Was called today and was told toi come to the ed here because he has sometype  Blood problem  the patient  C/o lower back pain and could not drive here tonight  Parents brought him in but left pt resting when not disturbed

## 2017-05-23 NOTE — ED Notes (Addendum)
Per lab blood cultures pos for gram variable rods, dr little notified,  rn was told to call pt to have him return to hospital for treatment,  Pt called no answer message was left on pt phone to call [facility]   Sam Leeann Bady rn   05/22/17    2335

## 2017-05-23 NOTE — ED Notes (Addendum)
Pt has not returned call was called again,  No answer, another message was left on phone to call [facility]   Sam Jaaron Oleson  rn  05/23/17  [phone]

## 2017-05-23 NOTE — ED Triage Notes (Signed)
Pt states that he was called to return to the ED for possible sepsis. Pt reports continued back pain as well. Pt alert in triage.
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
## 2017-05-24 ENCOUNTER — Encounter (HOSPITAL_COMMUNITY): Payer: Self-pay | Admitting: Internal Medicine

## 2017-05-24 ENCOUNTER — Emergency Department (HOSPITAL_COMMUNITY): Payer: Self-pay

## 2017-05-24 ENCOUNTER — Inpatient Hospital Stay (HOSPITAL_COMMUNITY): Payer: Self-pay

## 2017-05-24 DIAGNOSIS — B952 Enterococcus as the cause of diseases classified elsewhere: Secondary | ICD-10-CM

## 2017-05-24 DIAGNOSIS — Z720 Tobacco use: Secondary | ICD-10-CM

## 2017-05-24 DIAGNOSIS — F1990 Other psychoactive substance use, unspecified, uncomplicated: Secondary | ICD-10-CM | POA: Diagnosis present

## 2017-05-24 DIAGNOSIS — F1510 Other stimulant abuse, uncomplicated: Secondary | ICD-10-CM | POA: Diagnosis present

## 2017-05-24 DIAGNOSIS — R7881 Bacteremia: Secondary | ICD-10-CM | POA: Diagnosis present

## 2017-05-24 DIAGNOSIS — M549 Dorsalgia, unspecified: Secondary | ICD-10-CM | POA: Diagnosis present

## 2017-05-24 DIAGNOSIS — M4647 Discitis, unspecified, lumbosacral region: Secondary | ICD-10-CM | POA: Diagnosis present

## 2017-05-24 DIAGNOSIS — R079 Chest pain, unspecified: Secondary | ICD-10-CM

## 2017-05-24 DIAGNOSIS — E119 Type 2 diabetes mellitus without complications: Secondary | ICD-10-CM

## 2017-05-24 LAB — BLOOD CULTURE ID PANEL (REFLEXED)
ACINETOBACTER BAUMANNII: NOT DETECTED
CANDIDA KRUSEI: NOT DETECTED
CARBAPENEM RESISTANCE: NOT DETECTED
Candida albicans: NOT DETECTED
Candida glabrata: NOT DETECTED
Candida parapsilosis: NOT DETECTED
Candida tropicalis: NOT DETECTED
ENTEROBACTERIACEAE SPECIES: NOT DETECTED
ENTEROCOCCUS SPECIES: DETECTED — AB
Enterobacter cloacae complex: NOT DETECTED
Escherichia coli: NOT DETECTED
Haemophilus influenzae: NOT DETECTED
KLEBSIELLA OXYTOCA: NOT DETECTED
Klebsiella pneumoniae: NOT DETECTED
LISTERIA MONOCYTOGENES: NOT DETECTED
Methicillin resistance: NOT DETECTED
NEISSERIA MENINGITIDIS: NOT DETECTED
PSEUDOMONAS AERUGINOSA: NOT DETECTED
Proteus species: NOT DETECTED
SERRATIA MARCESCENS: NOT DETECTED
STAPHYLOCOCCUS AUREUS BCID: NOT DETECTED
STAPHYLOCOCCUS SPECIES: NOT DETECTED
STREPTOCOCCUS PYOGENES: NOT DETECTED
STREPTOCOCCUS SPECIES: NOT DETECTED
Streptococcus agalactiae: NOT DETECTED
Streptococcus pneumoniae: NOT DETECTED
VANCOMYCIN RESISTANCE: NOT DETECTED

## 2017-05-24 LAB — CBC
HEMATOCRIT: 36.1 % — AB (ref 39.0–52.0)
HEMOGLOBIN: 11.9 g/dL — AB (ref 13.0–17.0)
MCH: 28.8 pg (ref 26.0–34.0)
MCHC: 33 g/dL (ref 30.0–36.0)
MCV: 87.4 fL (ref 78.0–100.0)
Platelets: 143 10*3/uL — ABNORMAL LOW (ref 150–400)
RBC: 4.13 MIL/uL — ABNORMAL LOW (ref 4.22–5.81)
RDW: 12.7 % (ref 11.5–15.5)
WBC: 10.1 10*3/uL (ref 4.0–10.5)

## 2017-05-24 LAB — GLUCOSE, CAPILLARY
GLUCOSE-CAPILLARY: 257 mg/dL — AB (ref 65–99)
GLUCOSE-CAPILLARY: 212 mg/dL — AB (ref 65–99)
GLUCOSE-CAPILLARY: 367 mg/dL — AB (ref 65–99)
Glucose-Capillary: 187 mg/dL — ABNORMAL HIGH (ref 65–99)

## 2017-05-24 LAB — APTT: APTT: 28 s (ref 24–36)

## 2017-05-24 LAB — BASIC METABOLIC PANEL
ANION GAP: 9 (ref 5–15)
BUN: 9 mg/dL (ref 6–20)
CO2: 22 mmol/L (ref 22–32)
Calcium: 8.5 mg/dL — ABNORMAL LOW (ref 8.9–10.3)
Chloride: 106 mmol/L (ref 101–111)
Creatinine, Ser: 0.59 mg/dL — ABNORMAL LOW (ref 0.61–1.24)
GFR calc Af Amer: >60 mL/min (ref >60)
Glucose, Bld: 289 mg/dL — ABNORMAL HIGH (ref 65–99)
POTASSIUM: 3.7 mmol/L (ref 3.5–5.1)
SODIUM: 137 mmol/L (ref 135–145)

## 2017-05-24 LAB — RAPID URINE DRUG SCREEN, HOSP PERFORMED
AMPHETAMINES: POSITIVE — AB
Barbiturates: NOT DETECTED
Benzodiazepines: NOT DETECTED
COCAINE: NOT DETECTED
OPIATES: NOT DETECTED
TETRAHYDROCANNABINOL: NOT DETECTED

## 2017-05-24 LAB — ECHOCARDIOGRAM COMPLETE
HEIGHTINCHES: 69 in
WEIGHTICAEL: 3040.58 [oz_av]

## 2017-05-24 LAB — CBG MONITORING, ED: GLUCOSE-CAPILLARY: 248 mg/dL — AB (ref 65–99)

## 2017-05-24 LAB — HIV ANTIBODY (ROUTINE TESTING W REFLEX): HIV Screen 4th Generation wRfx: NONREACTIVE

## 2017-05-24 MED ORDER — SODIUM CHLORIDE 0.9 % IV SOLN
INTRAVENOUS | Status: DC
  Administered 2017-05-24: 06:00:00 via INTRAVENOUS

## 2017-05-24 MED ORDER — ZOLPIDEM TARTRATE 5 MG PO TABS
5.0000 mg | ORAL_TABLET | Freq: Every evening | ORAL | Status: DC | PRN
  Administered 2017-05-25 – 2017-05-28 (×4): 5 mg via ORAL
  Filled 2017-05-24 (×4): qty 1

## 2017-05-24 MED ORDER — INSULIN ASPART 100 UNIT/ML ~~LOC~~ SOLN
0.0000 [IU] | Freq: Three times a day (TID) | SUBCUTANEOUS | Status: DC
  Administered 2017-05-24: 5 [IU] via SUBCUTANEOUS
  Administered 2017-05-24: 2 [IU] via SUBCUTANEOUS
  Administered 2017-05-24: 3 [IU] via SUBCUTANEOUS
  Administered 2017-05-25: 5 [IU] via SUBCUTANEOUS
  Administered 2017-05-25: 3 [IU] via SUBCUTANEOUS
  Administered 2017-05-25: 5 [IU] via SUBCUTANEOUS
  Administered 2017-05-26: 2 [IU] via SUBCUTANEOUS
  Administered 2017-05-26 (×2): 5 [IU] via SUBCUTANEOUS
  Administered 2017-05-27: 3 [IU] via SUBCUTANEOUS
  Administered 2017-05-27: 9 [IU] via SUBCUTANEOUS
  Administered 2017-05-27: 3 [IU] via SUBCUTANEOUS
  Administered 2017-05-28: 5 [IU] via SUBCUTANEOUS
  Administered 2017-05-28 (×2): 3 [IU] via SUBCUTANEOUS
  Administered 2017-05-29: 5 [IU] via SUBCUTANEOUS
  Administered 2017-05-29: 2 [IU] via SUBCUTANEOUS
  Administered 2017-05-29: 3 [IU] via SUBCUTANEOUS
  Administered 2017-05-30: 5 [IU] via SUBCUTANEOUS
  Administered 2017-05-30 (×2): 3 [IU] via SUBCUTANEOUS
  Administered 2017-05-31: 5 [IU] via SUBCUTANEOUS
  Administered 2017-05-31: 3 [IU] via SUBCUTANEOUS
  Administered 2017-05-31: 7 [IU] via SUBCUTANEOUS

## 2017-05-24 MED ORDER — INSULIN GLARGINE 100 UNIT/ML ~~LOC~~ SOLN
5.0000 [IU] | Freq: Every day | SUBCUTANEOUS | Status: DC
  Administered 2017-05-24 – 2017-05-25 (×2): 5 [IU] via SUBCUTANEOUS
  Filled 2017-05-24 (×2): qty 0.05

## 2017-05-24 MED ORDER — GADOBENATE DIMEGLUMINE 529 MG/ML IV SOLN
20.0000 mL | Freq: Once | INTRAVENOUS | Status: AC | PRN
  Administered 2017-05-24: 18 mL via INTRAVENOUS

## 2017-05-24 MED ORDER — DEXTROSE 5 % IV SOLN
2.0000 g | Freq: Once | INTRAVENOUS | Status: AC
  Administered 2017-05-24: 2 g via INTRAVENOUS
  Filled 2017-05-24: qty 2

## 2017-05-24 MED ORDER — OXYCODONE-ACETAMINOPHEN 5-325 MG PO TABS
1.0000 | ORAL_TABLET | ORAL | Status: DC | PRN
  Administered 2017-05-24 – 2017-05-29 (×22): 2 via ORAL
  Filled 2017-05-24 (×22): qty 2

## 2017-05-24 MED ORDER — ONDANSETRON HCL 4 MG/2ML IJ SOLN: 4.0000 mg | Freq: Three times a day (TID) | INTRAMUSCULAR | Status: DC | PRN

## 2017-05-24 MED ORDER — VANCOMYCIN HCL IN DEXTROSE 1-5 GM/200ML-% IV SOLN
1000.0000 mg | Freq: Three times a day (TID) | INTRAVENOUS | Status: DC
  Administered 2017-05-24 – 2017-05-26 (×6): 1000 mg via INTRAVENOUS
  Filled 2017-05-24 (×8): qty 200

## 2017-05-24 MED ORDER — OXYCODONE-ACETAMINOPHEN 5-325 MG PO TABS
1.0000 | ORAL_TABLET | ORAL | Status: DC | PRN
  Administered 2017-05-24 (×2): 1 via ORAL
  Filled 2017-05-24 (×2): qty 1

## 2017-05-24 MED ORDER — SODIUM CHLORIDE 0.9 % IV BOLUS (SEPSIS)
1000.0000 mL | Freq: Once | INTRAVENOUS | Status: AC
  Administered 2017-05-24: 1000 mL via INTRAVENOUS

## 2017-05-24 MED ORDER — NICOTINE 21 MG/24HR TD PT24
21.0000 mg | MEDICATED_PATCH | Freq: Every day | TRANSDERMAL | Status: DC
  Administered 2017-05-28: 21 mg via TRANSDERMAL
  Filled 2017-05-24 (×5): qty 1

## 2017-05-24 MED ORDER — VANCOMYCIN HCL 10 G IV SOLR
1500.0000 mg | Freq: Once | INTRAVENOUS | Status: AC
  Administered 2017-05-24: 1500 mg via INTRAVENOUS
  Filled 2017-05-24: qty 1500

## 2017-05-24 MED ORDER — DEXTROSE 5 % IV SOLN
1.0000 g | Freq: Three times a day (TID) | INTRAVENOUS | Status: DC
  Administered 2017-05-24 – 2017-05-26 (×6): 1 g via INTRAVENOUS
  Filled 2017-05-24 (×7): qty 1

## 2017-05-24 MED ORDER — ACETAMINOPHEN 325 MG PO TABS
650.0000 mg | ORAL_TABLET | Freq: Four times a day (QID) | ORAL | Status: DC | PRN
  Administered 2017-05-25: 650 mg via ORAL
  Filled 2017-05-24: qty 2

## 2017-05-24 MED ORDER — HYDROCODONE-ACETAMINOPHEN 5-325 MG PO TABS
1.0000 | ORAL_TABLET | Freq: Once | ORAL | Status: AC
  Administered 2017-05-24: 1 via ORAL
  Filled 2017-05-24: qty 1
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
## 2017-05-24 NOTE — ED Provider Notes (Addendum)
Receive the patient in signout from Dr. Eudelia Bunch,  Patient is a 46 year old male with a chief complaint of low back pain. This been going on for the past couple days. Was seen at [facility] yesterday and he refused to have an MRI performed. He ended up having positive blood cultures and was called back today. Grew gram-negative rods.  MRI is concerning for discitis of the L5-S1 region. There is also a small fluid collection there. Will start on broad-spectrum antibiotics. Discussed with neurosurgery. My exam with no focal neurologic deficits. Pulse motor and sensation is intact to bilateral lower extremities.   Discussed with neurosurgery will be by to see in the morning.  They feel likely IR will be involved to drain the small fluid collection.Will discuss with medicine for admission.  CRITICAL CARE Performed by: Rae Roam   Total critical care time: 35 minutes  Critical care time was exclusive of separately billable procedures and treating other patients.  Critical care was necessary to treat or prevent imminent or life-threatening deterioration.  Critical care was time spent personally by me on the following activities: development of treatment plan with patient and/or surrogate as well as nursing, discussions with consultants, evaluation of patient's response to treatment, examination of patient, obtaining history from patient or surrogate, ordering and performing treatments and interventions, ordering and review of laboratory studies, ordering and review of radiographic studies, pulse oximetry and re-evaluation of patient's condition.     The patients results and plan were reviewed and discussed.   Any x-rays performed were independently reviewed by myself.   Differential diagnosis were considered with the presenting HPI.  Medications  vancomycin (VANCOCIN) 1,500 mg in sodium chloride 0.9 % 500 mL IVPB (1,500 mg Intravenous New Bag/Given 05/24/17 0506)  insulin  glargine (LANTUS) injection 5 Units (5 Units Subcutaneous Given 05/24/17 0552)  0.9 %  sodium chloride infusion ( Intravenous New Bag/Given 05/24/17 0552)  nicotine (NICODERM CQ - dosed in mg/24 hours) patch 21 mg (not administered)  insulin aspart (novoLOG) injection 0-9 Units (not administered)  oxyCODONE-acetaminophen (PERCOCET/ROXICET) 5-325 MG per tablet 1 tablet (not administered)  ondansetron (ZOFRAN) injection 4 mg (not administered)  zolpidem (AMBIEN) tablet 5 mg (not administered)  acetaminophen (TYLENOL) tablet 650 mg (not administered)  vancomycin (VANCOCIN) IVPB 1000 mg/200 mL premix (not administered)  ceFEPIme (MAXIPIME) 1 g in dextrose 5 % 50 mL IVPB (not administered)  HYDROcodone-acetaminophen (NORCO/VICODIN) 5-325 MG per tablet 1 tablet (1 tablet Oral Given 05/23/17 2243)  HYDROcodone-acetaminophen (NORCO/VICODIN) 5-325 MG per tablet 1 tablet (1 tablet Oral Given 05/24/17 0309)  gadobenate dimeglumine (MULTIHANCE) injection 20 mL (18 mLs Intravenous Contrast Given 05/24/17 0310)  ceFEPIme (MAXIPIME) 2 g in dextrose 5 % 50 mL IVPB (0 g Intravenous Stopped 05/24/17 0455)  sodium chloride 0.9 % bolus 1,000 mL (1,000 mLs Intravenous New Bag/Given 05/24/17 0511)    Vitals:   05/24/17 0430 05/24/17 0500 05/24/17 0530 05/24/17 0600  BP: 118/75 120/74 125/83 124/83  Pulse: 67 63 64 68  Resp:      Temp:      TempSrc:      SpO2: 99% 98% 100% 100%    Final diagnoses:  Diabetes mellitus without complication (HCC)  IVDU (intravenous drug user)  Methamphetamine abuse  Tobacco abuse  Discitis of lumbar region    Admission/ observation were discussed with the admitting physician, patient and/or family and they are comfortable with the plan.     Melene Plan, DO 05/24/17 1610    Melene Plan,  DO 05/24/17 [phone]    Melene PlanFloyd, Treylin Burtch, DO 05/24/17 [phone]

## 2017-05-24 NOTE — Progress Notes (Signed)
  PHARMACY - PHYSICIAN COMMUNICATION CRITICAL VALUE ALERT - BLOOD CULTURE IDENTIFICATION (BCID)  Results for orders placed or performed during the hospital encounter of 05/23/17  Blood Culture ID Panel (Reflexed) (Collected: 05/23/2017  7:05 PM)  Result Value Ref Range   Enterococcus species DETECTED (A) NOT DETECTED   Vancomycin resistance NOT DETECTED NOT DETECTED   Listeria monocytogenes NOT DETECTED NOT DETECTED   Staphylococcus species NOT DETECTED NOT DETECTED   Staphylococcus aureus NOT DETECTED NOT DETECTED   Methicillin resistance NOT DETECTED NOT DETECTED   Streptococcus species NOT DETECTED NOT DETECTED   Streptococcus agalactiae NOT DETECTED NOT DETECTED   Streptococcus pneumoniae NOT DETECTED NOT DETECTED   Streptococcus pyogenes NOT DETECTED NOT DETECTED   Acinetobacter baumannii NOT DETECTED NOT DETECTED   Enterobacteriaceae species NOT DETECTED NOT DETECTED   Enterobacter cloacae complex NOT DETECTED NOT DETECTED   Escherichia coli NOT DETECTED NOT DETECTED   Klebsiella oxytoca NOT DETECTED NOT DETECTED   Klebsiella pneumoniae NOT DETECTED NOT DETECTED   Proteus species NOT DETECTED NOT DETECTED   Serratia marcescens NOT DETECTED NOT DETECTED   Carbapenem resistance NOT DETECTED NOT DETECTED   Haemophilus influenzae NOT DETECTED NOT DETECTED   Neisseria meningitidis NOT DETECTED NOT DETECTED   Pseudomonas aeruginosa NOT DETECTED NOT DETECTED   Candida albicans NOT DETECTED NOT DETECTED   Candida glabrata NOT DETECTED NOT DETECTED   Candida krusei NOT DETECTED NOT DETECTED   Candida parapsilosis NOT DETECTED NOT DETECTED   Candida tropicalis NOT DETECTED NOT DETECTED    Name of physician (or Provider) Contacted: M. Mikhail  Blood cx with enterococcus species. Also with discitis. Cx's from 9/3 growing GNRs / GVRs?  Changes to prescribed antibiotics required: Continue current abx  Enzo BiNathan Chrystian Ressler, PharmD, [facility] Partnership[facility] Clinical Pharmacist Pager [phone] 05/24/2017 2:33  PM

## 2017-05-24 NOTE — ED Notes (Signed)
Pt transported to MRI 

## 2017-05-24 NOTE — ED Notes (Signed)
Pt back from MRI 

## 2017-05-24 NOTE — Consult Note (Addendum)
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 DETECTED Final   Enterobacteriaceae species NOT DETECTED NOT DETECTED Final   Enterobacter cloacae complex NOT DETECTED NOT DETECTED Final   Escherichia coli NOT DETECTED NOT DETECTED Final   Klebsiella oxytoca NOT DETECTED NOT DETECTED Final   Klebsiella pneumoniae NOT DETECTED NOT DETECTED Final   Proteus species NOT DETECTED NOT DETECTED Final   Serratia marcescens NOT DETECTED NOT DETECTED Final   Carbapenem resistance NOT DETECTED NOT DETECTED Final   Haemophilus influenzae NOT DETECTED NOT DETECTED Final   Neisseria meningitidis NOT DETECTED NOT DETECTED Final   Pseudomonas aeruginosa NOT DETECTED NOT DETECTED Final   Candida albicans NOT DETECTED NOT DETECTED Final   Candida glabrata NOT DETECTED NOT DETECTED Final   Candida krusei NOT DETECTED NOT DETECTED Final   Candida parapsilosis NOT DETECTED NOT DETECTED  Final   Candida tropicalis NOT DETECTED NOT DETECTED Final      05/24/2017, 3:56 PM     LOS: 0 days    Records and images were personally reviewed where available.  Dan Rumpf, MD [facility] [phone] 05/24/2017, 3:56 PM

## 2017-05-24 NOTE — Progress Notes (Addendum)
Inpatient Diabetes Program Recommendations  AACE/ADA: New Consensus Statement on Inpatient Glycemic Control (2015)  Target Ranges:  Prepandial:   less than 140 mg/dL      Peak postprandial:   less than 180 mg/dL (1-2 hours)      Critically ill patients:  140 - 180 mg/dL   Lab Results  Component Value Date   GLUCAP 257 (H) 05/24/2017   HGBA1C 11.5 (H) 05/20/2017    Review of Glycemic Control Results for Dan [name], Dan [name] (MRN [id number]) as of 05/24/2017 11:58  Ref. Range 05/20/2017 21:13 05/21/2017 00:11 05/23/2017 18:57 05/24/2017 06:02 05/24/2017 06:53  Glucose-Capillary Latest Ref Range: 65 - 99 mg/dL 425174 (H) 956328 (H) 387433 (H) 248 (H) 257 (H)   Diabetes history: DM2 Outpatient Diabetes medications: None Current orders for Inpatient glycemic control: Lantus 5 units qd + Novolog sensitive correction tid  Inpatient Diabetes Program Recommendations:    Please consider: -increase Lantus to 10-15 units -Add Novolog 0-5 units correction scale hs  2:00 pm Spoke with patient @ bedside but patient states he doesn't feel like talking right now. Gave patient handouts on A1c and nutrition and gave him information regarding A1c 11.5 (283 average blood glucose over the past 2-3 months).  Thank you, Billy FischerJudy E. Aletheia Tangredi, RN, MSN, CDE  Diabetes Coordinator Inpatient Glycemic Control Team Team Pager [phone] (8am-5pm) 05/24/2017 12:06 PM

## 2017-05-24 NOTE — Progress Notes (Signed)
  Echocardiogram 2D Echocardiogram has been performed.  Dan [name] 05/24/2017, 5:03 PM

## 2017-05-24 NOTE — Progress Notes (Signed)
Orthopedic Tech Progress Note Patient Details:  Elpidio GaleaJohn W Meller [birth year] [id number]  Ortho Devices Ortho Device/Splint Location: applied overhead frame with trapeze bar. Ortho Device/Splint Interventions: Application   Alvina ChouWilliams, Courtez Twaddle C 05/24/2017, 3:15 PM

## 2017-05-24 NOTE — Progress Notes (Signed)
Pharmacy Antibiotic Note  Dan GaleaJohn W [name] is a 46 y.o. male admitted on 05/23/2017 with diskitis.  Pharmacy has been consulted for Vancocin and Zosyn dosing.  Plan: Rec'd vanc 1500mg  and cefepime 2g in ED. Vancomycin 1000mg  IV every 8 hours.  Goal trough 15-20 mcg/mL.  Cefepime 1g IV every 8 hours.   Temp (24hrs), Avg:98.3 F (36.8 C), Min:98.3 F (36.8 C), Max:98.3 F (36.8 C)   Recent Labs Lab 05/20/17 1203 05/20/17 1217 05/22/17 0250 05/23/17 1859 05/23/17 1909 05/23/17 2300  WBC 15.5*  --  9.7 11.3*  --   --   CREATININE 0.94  --  0.78 0.73  --   --   LATICACIDVEN  --  1.49  --   --  1.32 1.34    Estimated Creatinine Clearance: 121.9 mL/min (by C-G formula based on SCr of 0.73 mg/dL).    Allergies  Allergen Reactions  . Bee Venom Anaphylaxis, Shortness Of Breath and Swelling  . Penicillins Hives    Has patient had a PCN reaction causing immediate rash, facial/tongue/throat swelling, SOB or lightheadedness with hypotension: Yes Has patient had a PCN reaction causing severe rash involving mucus membranes or skin necrosis: Unknown Has patient had a PCN reaction that required hospitalization: No (was already IN the hosp) Has patient had a PCN reaction occurring within the last 10 years: No If all of the above answers are "NO", then may proceed with Cephalosporin use.     Thank you for allowing pharmacy to be a part of this patient's care.  Dan GamblesVeronda Contrell [name], PharmD, BCPS  05/24/2017 4:59 AM

## 2017-05-24 NOTE — Progress Notes (Signed)
Patient ID: Elpidio GaleaJohn W Marti, male   DOB: Oct 25, 1970, 46 y.o.   MRN: [id number]   Request for L5-S1 disc aspiration from NS  HX: IVDU; +BC +discitis on imaging  IMPRESSION: 1. Mild enhancement within the L5 and S1 vertebral bodies and small marginally enhancing fluid collection within the anterior disc space likely represents acute discitis or erosive osteoarthritis. Acute inflammation is superimposed on extensive chronic inflammatory changes which correspond to sclerosis and eburnation on prior CT. 2. Mild prevertebral edema at L5-S1 is likely reactive. No paravertebral or epidural fluid collection identified. 3. Moderate disc bulge at L5-S1 eccentric to the left with moderate left and mild right foraminal stenosis. 4. Otherwise no significant foraminal or canal stenosis of the thoracic and lumbar spine.  Dr Corliss Skainseveshwar has reviewed imaging and feels definite discitis  Rec: antibiotic treatment Feels aspiration not necessary in this pt. In setting of known IVDU and +BC  BLOOD RIGHT ANTECUBITAL    Special Requests BOTTLES DRAWN AEROBIC AND ANAEROBIC Blood Culture adequate volume   Culture Setup Time GRAM NEGATIVE RODS  ANAEROBIC BOTTLE ONLY  CRITICAL VALUE NOTED. VALUE IS CONSISTENT WITH PREVIOUSLY REPORTED AND CALLED VALUE.      Culture GRAM NEGATIVE RODS   I have spoken to Dr Catha GosselinMikhail and Marcello FennelLM to V Costella PAC

## 2017-05-24 NOTE — H&P (Addendum)
History and Physical    JAKAREE PICKARD MWU:[id number] DOB: 12/16/1970 DOA: 05/23/2017  Referring MD/NP/PA:   PCP: Patient, No Pcp Per   Patient coming from:  The patient is coming from home.  At baseline, pt is independent for most of ADL.  Chief Complaint: Lower back pain, urinary incontinence  HPI: WESTYN DRIGGERS is a 46 y.o. male with medical history significant of diabetes mellitus, IV drug use, methamphetamine abuse, tobacco abuse, medication noncompliance, who presents with lower back pain and a urinary incontinence.  Patient states that he has been having lower back pain for about 10 days, which has been progressively getting worse. His back pain is constant, 10 out of 10 in severity, sharp, radiating to left leg posteriorly. He has intermittent urinary incontinence. He denies weakness in the legs. No fever or chills. Patient does not have chest pain, shortness breath, cough, nausea, vomiting, diarrhea, abdominal pain, symptoms of UTI. No headache, photophobia, neck rigidity. Patient was seen in  ED at [facility] yesterday and he refused to have an MRI performed. He ended up having positive blood cultures for Grew gram-negative rods, and was called back today.   ED Course: pt was found to have WBC 11.3, lactic acid 1.32, 1.34, negative urinalysis, electrolytes renal function okay, temperature normal, no tachycardia, O2 sat 98% on room air. MRI of spin is concerning for discitis of the L5-S1 region. There is also a small fluid collection there. Patient is admitted to the MedSurg bed as inpatient. Neurosurgeon was consulted by EDP.  Review of Systems:   General: no fevers, chills, no body weight gain, has poor appetite, has fatigue HEENT: no blurry vision, hearing changes or sore throat Respiratory: no dyspnea, coughing, wheezing CV: no chest pain, no palpitations GI: no nausea, vomiting, abdominal pain, diarrhea, constipation GU: no dysuria, burning on urination, increased urinary frequency,  hematuria. Has urinary incontinence  Ext: no leg edema Neuro: no unilateral weakness, numbness, or tingling, no vision change or hearing loss. Skin: no rash, no skin tear. MSK: has lower back pain. Heme: No easy bruising.  Travel history: No recent long distant travel.  Allergy:  Allergies  Allergen Reactions  . Bee Venom Anaphylaxis, Shortness Of Breath and Swelling  . Penicillins Hives    Has patient had a PCN reaction causing immediate rash, facial/tongue/throat swelling, SOB or lightheadedness with hypotension: Yes Has patient had a PCN reaction causing severe rash involving mucus membranes or skin necrosis: Unknown Has patient had a PCN reaction that required hospitalization: No (was already IN the hosp) Has patient had a PCN reaction occurring within the last 10 years: No If all of the above answers are "NO", then may proceed with Cephalosporin use.     Past Medical History:  Diagnosis Date  . Diabetes mellitus without complication ([place])   . IVDU (intravenous drug user)   . Methamphetamine abuse   . Tobacco abuse     History reviewed. No pertinent surgical history.  Social History:  reports that he has been smoking Cigarettes.  He has been smoking about 0.50 packs per day. He has never used smokeless tobacco. He reports that he uses drugs, including Methamphetamines. He reports that he does not drink alcohol.  Family History:  Family History  Problem Relation Age of Onset  . Diabetes Other   . Diabetes Maternal Grandfather      Prior to Admission medications   Not on File    Physical Exam: Vitals:   05/24/17 0330 05/24/17 0400 05/24/17 0430  05/24/17 0500  BP: 119/82 120/72 118/75 120/74  Pulse: 71 67 67 63  Resp:      Temp:      TempSrc:      SpO2: 99% 99% 99% 98%   General: Not in acute distress HEENT:       Eyes: PERRL, EOMI, no scleral icterus.       ENT: No discharge from the ears and nose, no pharynx injection, no tonsillar enlargement.         Neck: No JVD, no bruit, no mass felt. Heme: No neck lymph node enlargement. Cardiac: S1/S2, RRR, No murmurs, No gallops or rubs. Respiratory: No rales, wheezing, rhonchi or rubs. GI: Soft, nondistended, nontender, no rebound pain, no organomegaly, BS present. GU: No hematuria Ext: No pitting leg edema bilaterally. 2+DP/PT pulse bilaterally. Musculoskeletal: No joint deformities, No joint redness or warmth, no limitation of ROM in spin. Skin: No rashes.  Neuro: Alert, oriented X3, cranial nerves II-XII grossly intact, moves all extremities normally. Muscle strength 5/5 in all extremities, sensation to light touch intact. Knee reflex cannot be induced. Negative Babinski's sign. Psych: Patient is not psychotic, no suicidal or hemocidal ideation.  Labs on Admission: I have personally reviewed following labs and imaging studies  CBC:  Recent Labs Lab 05/20/17 1203 05/22/17 0250 05/23/17 1859 05/24/17 0512  WBC 15.5* 9.7 11.3* 10.1  NEUTROABS 11.6* 7.3 9.6*  --   HGB 12.8* 12.4* 12.6* 11.9*  HCT 37.6* 37.2* 37.7* 36.1*  MCV 87.0 87.7 86.5 87.4  PLT 130* 123* 141* 638*   Basic Metabolic Panel:  Recent Labs Lab 05/20/17 1203 05/22/17 0250 05/23/17 1859  NA 138 136 136  K 3.8 3.5 3.9  CL 106 104 103  CO2 20* 24 23  GLUCOSE 322* 401* 477*  BUN 9 11 15   CREATININE 0.94 0.78 0.73  CALCIUM 8.5* 8.4* 8.6*   GFR: Estimated Creatinine Clearance: 121.9 mL/min (by C-G formula based on SCr of 0.73 mg/dL). Liver Function Tests:  Recent Labs Lab 05/20/17 1203 05/23/17 1859  AST 24 20  ALT 24 15*  ALKPHOS 50 65  BILITOT 0.6 0.8  PROT 6.8 6.4*  ALBUMIN 3.9 3.1*    Recent Labs Lab 05/20/17 1203  LIPASE 24    Recent Labs Lab 05/20/17 1410  AMMONIA 18   Coagulation Profile:  Recent Labs Lab 05/23/17 1859  INR 0.92   Cardiac Enzymes:  Recent Labs Lab 05/20/17 1203 05/20/17 1410 05/20/17 1641 05/20/17 2018  CKTOTAL 419*  --   --   --   TROPONINI  --  <0.03  <0.03 <0.03   BNP (last 3 results) No results for input(s): PROBNP in the last 8760 hours. HbA1C: No results for input(s): HGBA1C in the last 72 hours. CBG:  Recent Labs Lab 05/20/17 1639 05/20/17 2113 05/21/17 0011 05/23/17 1857  GLUCAP 243* 174* 328* 433*   Lipid Profile: No results for input(s): CHOL, HDL, LDLCALC, TRIG, CHOLHDL, LDLDIRECT in the last 72 hours. Thyroid Function Tests: No results for input(s): TSH, T4TOTAL, FREET4, T3FREE, THYROIDAB in the last 72 hours. Anemia Panel: No results for input(s): VITAMINB12, FOLATE, FERRITIN, TIBC, IRON, RETICCTPCT in the last 72 hours. Urine analysis:    Component Value Date/Time   COLORURINE YELLOW 05/23/2017 2306   APPEARANCEUR CLEAR 05/23/2017 2306   LABSPEC 1.035 (H) 05/23/2017 2306   PHURINE 5.0 05/23/2017 2306   GLUCOSEU >=500 (A) 05/23/2017 2306   HGBUR NEGATIVE 05/23/2017 2306   BILIRUBINUR NEGATIVE 05/23/2017 2306   KETONESUR 5 (A)  05/23/2017 2306   PROTEINUR NEGATIVE 05/23/2017 2306   UROBILINOGEN 2.0 (H) 07/30/2015 2258   NITRITE NEGATIVE 05/23/2017 2306   LEUKOCYTESUR NEGATIVE 05/23/2017 2306   Sepsis Labs: @LABRCNTIP (procalcitonin:4,lacticidven:4) ) Recent Results (from the past 240 hour(s))  Culture, blood (Routine X 2) w Reflex to ID Panel     Status: Abnormal (Preliminary result)   Collection Time: 05/20/17  2:05 PM  Result Value Ref Range Status   Specimen Description BLOOD RIGHT HAND  Final   Special Requests   Final    BOTTLES DRAWN AEROBIC AND ANAEROBIC Blood Culture adequate volume   Culture  Setup Time (A)  Final    GRAM VARIABLE ROD ANAEROBIC BOTTLE ONLY Organism ID to follow CRITICAL RESULT CALLED TO, READ BACK BY AND VERIFIED WITH: S COBLE RN [id number] 05/22/17 A BROWNING    Culture GRAM NEGATIVE RODS  Final   Report Status PENDING  Incomplete  Blood Culture ID Panel (Reflexed)     Status: None   Collection Time: 05/20/17  2:05 PM  Result Value Ref Range Status   Enterococcus species NOT  DETECTED NOT DETECTED Final   Vancomycin resistance NOT DETECTED NOT DETECTED Final   Listeria monocytogenes NOT DETECTED NOT DETECTED Final   Staphylococcus species NOT DETECTED NOT DETECTED Final   Staphylococcus aureus NOT DETECTED NOT DETECTED Final   Methicillin resistance NOT DETECTED NOT DETECTED Final   Streptococcus species NOT DETECTED NOT DETECTED Final   Streptococcus agalactiae NOT DETECTED NOT DETECTED Final   Streptococcus pneumoniae NOT DETECTED NOT DETECTED Final   Streptococcus pyogenes NOT DETECTED NOT DETECTED Final   Acinetobacter baumannii NOT DETECTED NOT DETECTED Final   Enterobacteriaceae species NOT DETECTED NOT DETECTED Final   Enterobacter cloacae complex NOT DETECTED NOT DETECTED Final   Escherichia coli NOT DETECTED NOT DETECTED Final   Klebsiella oxytoca NOT DETECTED NOT DETECTED Final   Klebsiella pneumoniae NOT DETECTED NOT DETECTED Final   Proteus species NOT DETECTED NOT DETECTED Final   Serratia marcescens NOT DETECTED NOT DETECTED Final   Carbapenem resistance NOT DETECTED NOT DETECTED Final   Haemophilus influenzae NOT DETECTED NOT DETECTED Final   Neisseria meningitidis NOT DETECTED NOT DETECTED Final   Pseudomonas aeruginosa NOT DETECTED NOT DETECTED Final   Candida albicans NOT DETECTED NOT DETECTED Final   Candida glabrata NOT DETECTED NOT DETECTED Final   Candida krusei NOT DETECTED NOT DETECTED Final   Candida parapsilosis NOT DETECTED NOT DETECTED Final   Candida tropicalis NOT DETECTED NOT DETECTED Final  Culture, blood (Routine X 2) w Reflex to ID Panel     Status: None (Preliminary result)   Collection Time: 05/20/17  2:10 PM  Result Value Ref Range Status   Specimen Description BLOOD RIGHT ANTECUBITAL  Final   Special Requests   Final    BOTTLES DRAWN AEROBIC AND ANAEROBIC Blood Culture adequate volume   Culture  Setup Time   Final    GRAM NEGATIVE RODS ANAEROBIC BOTTLE ONLY CRITICAL VALUE NOTED.  VALUE IS CONSISTENT WITH PREVIOUSLY  REPORTED AND CALLED VALUE.    Culture GRAM NEGATIVE RODS  Final   Report Status PENDING  Incomplete  MRSA PCR Screening     Status: None   Collection Time: 05/20/17  6:27 PM  Result Value Ref Range Status   MRSA by PCR NEGATIVE NEGATIVE Final    Comment:        The GeneXpert MRSA Assay (FDA approved for NASAL specimens only), is one component of a comprehensive MRSA colonization surveillance program. It  is not intended to diagnose MRSA infection nor to guide or monitor treatment for MRSA infections.      Radiological Exams on Admission: Dg Chest 2 View  Result Date: 05/23/2017 CLINICAL DATA:  Back pain.  Possible sepsis. EXAM: CHEST  2 VIEW COMPARISON:  May 20, 2017 FINDINGS: The heart size and mediastinal contours are within normal limits. Both lungs are clear. The visualized skeletal structures are unremarkable. IMPRESSION: No active cardiopulmonary disease. Electronically Signed   By: Dorise Bullion III M.D   On: 05/23/2017 19:35   Mr Thoracic Spine W Wo Contrast  Result Date: 05/24/2017 CLINICAL DATA:  46 y/o M; history of IV drug abuse presenting with lower extremity weakness and difficulty ambulating due to pain. Patient endorses some urinary incontinence. EXAM: MRI THORACIC AND LUMBAR SPINE WITHOUT AND WITH CONTRAST TECHNIQUE: Multiplanar and multiecho pulse sequences of the thoracic and lumbar spine were obtained without and with intravenous contrast. CONTRAST:  31m MULTIHANCE GADOBENATE DIMEGLUMINE 529 MG/ML IV SOLN COMPARISON:  05/22/2017 CT lumbar spine. FINDINGS: MRI THORACIC SPINE FINDINGS Alignment:  Physiologic. Vertebrae: No evidence of fracture for discitis. No epidural collection. Multiple hemangioma the largest in the T5 vertebral body. Cord:  Normal signal and morphology.  No abnormal enhancement. Paraspinal and other soft tissues: Negative. Disc levels: T3-4 small right subarticular disc protrusion contacts the right anterior cord without significant canal or  foraminal stenosis. Otherwise no significant disc displacement, foraminal stenosis, or canal stenosis. Prominent ligamentum flavum hypertrophy at the T10-11 level. MRI LUMBAR SPINE FINDINGS Segmentation:  Standard. Alignment:  Physiologic. Vertebrae: Low signal within the opposing endplates at the LO9-G2level without significant edema or enhancement. This corresponds sclerosis on CT. There is enhancement and edema within the adjacent mid vertebral bodies (series 18, image 7) and there is a small fluid collection with marginal enhancement within the anterior disc space the (series 15, image 10 and series 18, image 10). Conus medullaris: Extends to the L1 level and appears normal. No abnormal enhancement. No epidural collection. Paraspinal and other soft tissues: Mild edema and enhancement within prevertebral soft tissues at the L5 and S1 levels. No paravertebral fluid collection. Disc levels: L1-2 through L4-5: No significant disc displacement, foraminal stenosis, or canal stenosis. L5-S1: Disc bulge and marginal osteophytes eccentric to the left. Mild right and moderate left foraminal stenosis. No significant canal stenosis. IMPRESSION: 1. Mild enhancement within the L5 and S1 vertebral bodies and small marginally enhancing fluid collection within the anterior disc space likely represents acute discitis or erosive osteoarthritis. Acute inflammation is superimposed on extensive chronic inflammatory changes which correspond to sclerosis and eburnation on prior CT. 2. Mild prevertebral edema at L5-S1 is likely reactive. No paravertebral or epidural fluid collection identified. 3. Moderate disc bulge at L5-S1 eccentric to the left with moderate left and mild right foraminal stenosis. 4. Otherwise no significant foraminal or canal stenosis of the thoracic and lumbar spine. Electronically Signed   By: LKristine GarbeM.D.   On: 05/24/2017 03:46   Mr Lumbar Spine W Wo Contrast  Result Date: 05/24/2017 CLINICAL  DATA:  46y/o M; history of IV drug abuse presenting with lower extremity weakness and difficulty ambulating due to pain. Patient endorses some urinary incontinence. EXAM: MRI THORACIC AND LUMBAR SPINE WITHOUT AND WITH CONTRAST TECHNIQUE: Multiplanar and multiecho pulse sequences of the thoracic and lumbar spine were obtained without and with intravenous contrast. CONTRAST:  141mMULTIHANCE GADOBENATE DIMEGLUMINE 529 MG/ML IV SOLN COMPARISON:  05/22/2017 CT lumbar spine. FINDINGS: MRI THORACIC SPINE  FINDINGS Alignment:  Physiologic. Vertebrae: No evidence of fracture for discitis. No epidural collection. Multiple hemangioma the largest in the T5 vertebral body. Cord:  Normal signal and morphology.  No abnormal enhancement. Paraspinal and other soft tissues: Negative. Disc levels: T3-4 small right subarticular disc protrusion contacts the right anterior cord without significant canal or foraminal stenosis. Otherwise no significant disc displacement, foraminal stenosis, or canal stenosis. Prominent ligamentum flavum hypertrophy at the T10-11 level. MRI LUMBAR SPINE FINDINGS Segmentation:  Standard. Alignment:  Physiologic. Vertebrae: Low signal within the opposing endplates at the O9-G2 level without significant edema or enhancement. This corresponds sclerosis on CT. There is enhancement and edema within the adjacent mid vertebral bodies (series 18, image 7) and there is a small fluid collection with marginal enhancement within the anterior disc space the (series 15, image 10 and series 18, image 10). Conus medullaris: Extends to the L1 level and appears normal. No abnormal enhancement. No epidural collection. Paraspinal and other soft tissues: Mild edema and enhancement within prevertebral soft tissues at the L5 and S1 levels. No paravertebral fluid collection. Disc levels: L1-2 through L4-5: No significant disc displacement, foraminal stenosis, or canal stenosis. L5-S1: Disc bulge and marginal osteophytes eccentric  to the left. Mild right and moderate left foraminal stenosis. No significant canal stenosis. IMPRESSION: 1. Mild enhancement within the L5 and S1 vertebral bodies and small marginally enhancing fluid collection within the anterior disc space likely represents acute discitis or erosive osteoarthritis. Acute inflammation is superimposed on extensive chronic inflammatory changes which correspond to sclerosis and eburnation on prior CT. 2. Mild prevertebral edema at L5-S1 is likely reactive. No paravertebral or epidural fluid collection identified. 3. Moderate disc bulge at L5-S1 eccentric to the left with moderate left and mild right foraminal stenosis. 4. Otherwise no significant foraminal or canal stenosis of the thoracic and lumbar spine. Electronically Signed   By: Kristine Garbe M.D.   On: 05/24/2017 03:46     EKG:   Not done in ED, will get one.   Assessment/Plan Principal Problem:   Discitis of lumbosacral region Active Problems:   Diabetes mellitus without complication ([facility])   IVDU (intravenous drug user)   Back pain   Methamphetamine abuse   Tobacco abuse   Bacteremia due to Gram-negative bacteria   Discitis of lumbosacral region and bacteremia: MRI of spin is concerning for discitis of the L5-S1 region. There is also a small fluid collection there. Pt is not septic. Hemodynamically stable. Blood culture was positive for Grew gram-negative rods. ED physician consulted neurosurgeon PA physician, and was told that neurosurgeon will see patient in morning (EDP did not remember neurosurgeon's name).  - will admit to med-surg bed as inpt - Empiric antimicrobial treatment with vancomycin and cefepime were stared in ED-->will continue - PRN Zofran for nausea, Percocet for pain - Blood cultures x 2  - ESR and CRP - IVF: 1.0 L of NS bolus in ED, followed by 125 cc/h - May need to consult ID in AM.   Diabetes mellitus without complication ([place]): X5M was 11.5 on 05/20/17, poorly  controlled. Patient is not taking any medications at home. Blood sugar 477. -Start the Lantus 5 units daily -ssi  Methamphetamine abuse, VDU and tobacco abuse: -Did counseling about importance of quitting substance use -Nicotine patch -UDS  DVT ppx: SCD Code Status: Full code Family Communication: None at bed side.    Disposition Plan:  Anticipate discharge back to previous home environment Consults called:  Neurosurgery  Admission status:  medical floor/obs    Date of Service 05/24/2017    Ivor Costa [facility] Pager [phone]  If 7PM-7AM, please contact night-coverage [email or web address] Password [email or web address] 05/24/2017, 5:53 AM

## 2017-05-24 NOTE — Progress Notes (Signed)
  PROGRESS NOTE    Dan GaleaJohn W [name]  ZOX:[id number] DOB: [birth year] DOA: 05/23/2017 PCP: Patient, No Pcp Per   Chief Complaint  Patient presents with  . Back Pain    Brief Narrative:  HPI On 05/24/2017 by Dr. Lorretta HarpXilin [name] Dan GaleaJohn W [name] is a 46 y.o. male with medical history significant of diabetes mellitus, IV drug use, methamphetamine abuse, tobacco abuse, medication noncompliance, who presents with lower back pain and a urinary incontinence.  Patient states that he has been having lower back pain for about 10 days, which has been progressively getting worse. His back pain is constant, 10 out of 10 in severity, sharp, radiating to left leg posteriorly. He has intermittent urinary incontinence. He denies weakness in the legs. No fever or chills. Patient does not have chest pain, shortness breath, cough, nausea, vomiting, diarrhea, abdominal pain, symptoms of UTI. No headache, photophobia, neck rigidity. Patient [facility]  [facility] yesterday and he refused to have an MRI performed. He ended up having positive blood cultures for Grewgram-negative rods, and was called back today.  Assessment & Plan   Admitted earlier today by Dr. Lorretta HarpXilin [name]. See H&P for details.  Discitis of the lumbosacral region/bacteremia/possible osteomyelitis/bacteremia -MRI concerning for discitis of the L5-S1 region -Currently hemodynamically stable -Blood cultures 1/2 from 05/20/2017 showed gram variable rod -Blood cultures 05/23/2017 detected enterococcus species -Neurosurgery consultation appreciated, recommended IR for disc aspiration -Discussed with interventional radiology, does not feel patient needs aspiration, continue antibiotic treatment -Continue vancomycin/cefepime -Echocardiogram pending  -Infectious disease consulted and appreciated -Continue pain control  Diabetes mellitus, 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 -Hemoglobin A1c 11.5 05/20/2017 -Patient does not take any medications at home -Continue  insulin sliding scale, Lantus, CBG monitoring  Polysubstance abuse  -Toxicology screen positive for amphetamines (patient has no home medications) -Continue nicotine patch  DVT Prophylaxis  SCDs  Code Status: Full  Family Communication: None at bedside  Disposition Plan: admitted, pending further workup  Consultants Neurourgery Interventional radiology Infectious disease  Procedures  None   LOS: 0 days   Time Spent in minutes   30 minutes  Dan [name] D.O. on 05/24/2017 at 2:53 PM  Between 7am to 7pm - Pager - [phone]  After 7pm go to [email or web address] - password [email or web address]  And look for the night coverage person covering for me after hours  [facility] Office  [phone]

## 2017-05-25 DIAGNOSIS — M4646 Discitis, unspecified, lumbar region: Secondary | ICD-10-CM

## 2017-05-25 DIAGNOSIS — M549 Dorsalgia, unspecified: Secondary | ICD-10-CM

## 2017-05-25 LAB — CULTURE, BLOOD (ROUTINE X 2): Special Requests: ADEQUATE

## 2017-05-25 LAB — HEPATITIS PANEL, ACUTE
HCV Ab: >11 s/co ratio — ABNORMAL HIGH (ref 0.0–0.9)
Hep A IgM: NEGATIVE
Hep B C IgM: NEGATIVE
Hepatitis B Surface Ag: NEGATIVE

## 2017-05-25 LAB — GLUCOSE, CAPILLARY
GLUCOSE-CAPILLARY: 282 mg/dL — AB (ref 65–99)
GLUCOSE-CAPILLARY: 248 mg/dL — AB (ref 65–99)
GLUCOSE-CAPILLARY: 186 mg/dL — AB (ref 65–99)
Glucose-Capillary: 283 mg/dL — ABNORMAL HIGH (ref 65–99)

## 2017-05-25 MED ORDER — MORPHINE SULFATE (PF) 4 MG/ML IV SOLN
2.0000 mg | Freq: Once | INTRAVENOUS | Status: AC
  Administered 2017-05-25: 2 mg via INTRAVENOUS
  Filled 2017-05-25: qty 1

## 2017-05-25 MED ORDER — INSULIN GLARGINE 100 UNIT/ML ~~LOC~~ SOLN
10.0000 [IU] | Freq: Every day | SUBCUTANEOUS | Status: DC
  Administered 2017-05-26 – 2017-05-27 (×2): 10 [IU] via SUBCUTANEOUS
  Filled 2017-05-25 (×2): qty 0.1

## 2017-05-25 MED ORDER — MORPHINE SULFATE (PF) 4 MG/ML IV SOLN
2.0000 mg | INTRAVENOUS | Status: DC | PRN
  Administered 2017-05-25 – 2017-05-26 (×4): 2 mg via INTRAVENOUS
  Filled 2017-05-25 (×5): qty 1

## 2017-05-25 NOTE — Progress Notes (Signed)
PROGRESS NOTE    CHIEF WALKUP  ZOX:[id number] DOB: [birth year] DOA: 05/23/2017 PCP: Patient, No Pcp Per   Chief Complaint  Patient presents with  . Back Pain    Brief Narrative:  HPI On 05/24/2017 by Dr. Rikki Spearing Powellis a 46 y.o.malewith medical history significant of diabetes mellitus, IV drug use, methamphetamine abuse, tobacco abuse, medication noncompliance, who presents with lower back pain and a urinary incontinence.  Patient states that he has been having lower back pain for about 10 days, which has been progressively getting worse. His back pain is constant, 10 out of 10 in severity, sharp, radiating to left leg posteriorly. He has intermittent urinary incontinence. He denies weakness in the legs. No fever or chills. Patient does not have chest pain, shortness breath, cough, nausea, vomiting, diarrhea, abdominal pain, symptoms of UTI. No headache, photophobia, neck rigidity. [facility] refused to have an MRI performed. He ended up having positive blood cultures for Grewgram-negative rods, and was called back today.   Assessment & Plan   Discitis of the lumbosacral region/bacteremia/possible osteomyelitis/bacteremia -MRI concerning for discitis of the L5-S1 region -Currently hemodynamically stable -Blood cultures 1/2 from 05/20/2017 showed gram variable rod -Blood culture 05/23/2017 detected enterococcus faecalis, Susceptibilities pending -Blood cultures from 05/24/2017 show no growth to date -Neurosurgery consultation appreciated, recommended IR for disc aspiration -Discussed with interventional radiology, does not feel patient needs aspiration, continue antibiotic treatment -Continue vancomycin/cefepime -Echocardiogram EF 55-60%, no RWMA -Infectious disease consulted and appreciated -Continue pain control  Diabetes mellitus, 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 -Hemoglobin A1c 11.5 05/20/2017 -Patient does not take any  medications at home -Presented with blood sugar of 433 -Continue insulin sliding scale, Lantus, CBG monitoring -Will increase Lantus to 10 units daily  Polysubstance abuse  -Toxicology screen positive for amphetamines (patient has no home medications) -Discussed cessation from all illicit drugs as well as tobacco use -Continue nicotine patch  Normocytic anemia -Hemoglobin currently 11.9, baseline approximately 12-14 -Continue to monitor CBC  DVT Prophylaxis  SCDs  Code Status: Full  Family Communication: None at bedside  Disposition Plan:  Admitted. Pending blood culture results and further infectious disease recommendations regarding IV antibiotics at length.  Consultants Neurourgery Interventional radiology Infectious disease  Procedures  Echocardiogram   Antibiotics   Anti-infectives    Start     Dose/Rate Route Frequency Ordered Stop   05/24/17 1400  ceFEPIme (MAXIPIME) 1 g in dextrose 5 % 50 mL IVPB     1 g 100 mL/hr over 30 Minutes Intravenous Every 8 hours 05/24/17 0503     05/24/17 1200  vancomycin (VANCOCIN) IVPB 1000 mg/200 mL premix     1,000 mg 200 mL/hr over 60 Minutes Intravenous Every 8 hours 05/24/17 0503     05/24/17 0400  vancomycin (VANCOCIN) 1,500 mg in sodium chloride 0.9 % 500 mL IVPB     1,500 mg 250 mL/hr over 120 Minutes Intravenous  Once 05/24/17 0353 05/24/17 0706   05/24/17 0400  ceFEPIme (MAXIPIME) 2 g in dextrose 5 % 50 mL IVPB     2 g 100 mL/hr over 30 Minutes Intravenous  Once 05/24/17 0353 05/24/17 0455      Subjective:   Theodoro Grist seen and examined today.  Patient continues to complain of back pain. States he will no longer use meth or smoke. Denies chest pain, shortness of breath, abdominal pain, nausea or vomiting, diarrhea constipation, dizziness or headache.  Objective:   Vitals:  05/24/17 0645 05/24/17 1432 05/24/17 2106 05/25/17 0632  BP: 116/70 113/73 (!) 93/53 116/69  Pulse: 73 79 78 79  Resp: 16 16 16 16     Temp: 98.3 F (36.8 C) 97.8 F (36.6 C) 98.4 F (36.9 C) 98.6 F (37 C)  TempSrc: Oral Oral Oral Oral  SpO2: 100% 100% 97% 99%  Weight: 86.2 kg (190 lb 0.6 oz)     Height: 5\' 9"  (1.753 m)       Intake/Output Summary (Last 24 hours) at 05/25/17 1424 Last data filed at 05/25/17 1027  Gross per 24 hour  Intake                0 ml  Output              700 ml  Net             -700 ml   Filed Weights   05/24/17 0645  Weight: 86.2 kg (190 lb 0.6 oz)    Exam  General: Well developed, well nourished, NAD, appears stated age  HEENT: NCAT, mucous membranes moist.   Cardiovascular: S1 S2 auscultated, no rubs, murmurs or gallops. Regular rate and rhythm.  Respiratory: Clear to auscultation bilaterally with equal chest rise  Abdomen: Soft, nontender, nondistended, + bowel sounds  Extremities: warm dry without cyanosis clubbing or edema  Neuro: AAOx3, nonfocal  Psych: Normal affect and demeanor with intact judgement and insight   Data Reviewed: I have personally reviewed following labs and imaging studies  CBC:  Recent Labs Lab 05/20/17 1203 05/22/17 0250 05/23/17 1859 05/24/17 0512  WBC 15.5* 9.7 11.3* 10.1  NEUTROABS 11.6* 7.3 9.6*  --   HGB 12.8* 12.4* 12.6* 11.9*  HCT 37.6* 37.2* 37.7* 36.1*  MCV 87.0 87.7 86.5 87.4  PLT 130* 123* 141* 143*   Basic Metabolic Panel:  Recent Labs Lab 05/20/17 1203 05/22/17 0250 05/23/17 1859 05/24/17 0512  NA 138 136 136 137  K 3.8 3.5 3.9 3.7  CL 106 104 103 106  CO2 20* 24 23 22   GLUCOSE 322* 401* 477* 289*  BUN 9 11 15 9   CREATININE 0.94 0.78 0.73 0.59*  CALCIUM 8.5* 8.4* 8.6* 8.5*   GFR: Estimated Creatinine Clearance: 124.2 mL/min (A) (by C-G formula based on SCr of 0.59 mg/dL (L)). Liver Function Tests:  Recent Labs Lab 05/20/17 1203 05/23/17 1859  AST 24 20  ALT 24 15*  ALKPHOS 50 65  BILITOT 0.6 0.8  PROT 6.8 6.4*  ALBUMIN 3.9 3.1*    Recent Labs Lab 05/20/17 1203  LIPASE 24    Recent  Labs Lab 05/20/17 1410  AMMONIA 18   Coagulation Profile:  Recent Labs Lab 05/23/17 1859  INR 0.92   Cardiac Enzymes:  Recent Labs Lab 05/20/17 1203 05/20/17 1410 05/20/17 1641 05/20/17 2018  CKTOTAL 419*  --   --   --   TROPONINI  --  <0.03 <0.03 <0.03   BNP (last 3 results) No results for input(s): PROBNP in the last 8760 hours. HbA1C: No results for input(s): HGBA1C in the last 72 hours. CBG:  Recent Labs Lab 05/24/17 1203 05/24/17 1628 05/24/17 2220 05/25/17 0632 05/25/17 1134  GLUCAP 187* 212* 367* 282* 248*   Lipid Profile: No results for input(s): CHOL, HDL, LDLCALC, TRIG, CHOLHDL, LDLDIRECT in the last 72 hours. Thyroid Function Tests: No results for input(s): TSH, T4TOTAL, FREET4, T3FREE, THYROIDAB in the last 72 hours. Anemia Panel: No results for input(s): VITAMINB12, FOLATE, FERRITIN, TIBC, IRON, RETICCTPCT in  the last 72 hours. Urine analysis:    Component Value Date/Time   COLORURINE YELLOW 05/23/2017 2306   APPEARANCEUR CLEAR 05/23/2017 2306   LABSPEC 1.035 (H) 05/23/2017 2306   PHURINE 5.0 05/23/2017 2306   GLUCOSEU >=500 (A) 05/23/2017 2306   HGBUR NEGATIVE 05/23/2017 2306   BILIRUBINUR NEGATIVE 05/23/2017 2306   KETONESUR 5 (A) 05/23/2017 2306   PROTEINUR NEGATIVE 05/23/2017 2306   UROBILINOGEN 2.0 (H) 07/30/2015 2258   NITRITE NEGATIVE 05/23/2017 2306   LEUKOCYTESUR NEGATIVE 05/23/2017 2306   Sepsis Labs: (procalcitonin:4,lacticidven:4)  ) Recent Results (from the past 240 hour(s))  Culture, blood (Routine X 2) w Reflex to ID Panel     Status: Abnormal   Collection Time: 05/20/17  2:05 PM  Result Value Ref Range Status   Specimen Description BLOOD RIGHT HAND  Final   Special Requests   Final    BOTTLES DRAWN AEROBIC AND ANAEROBIC Blood Culture adequate volume   Culture  Setup Time (A)  Final    GRAM VARIABLE ROD ANAEROBIC BOTTLE ONLY CRITICAL RESULT CALLED TO, READ BACK BY AND VERIFIED WITH: S COBLE RN 2303 05/22/17  A BROWNING    Culture (A)  Final    ANAEROBIC GRAM POSITIVE RODS UNABLE TO FURTHER IDENTIFY    Report Status 05/25/2017 FINAL  Final  Blood Culture ID Panel (Reflexed)     Status: None   Collection Time: 05/20/17  2:05 PM  Result Value Ref Range Status   Enterococcus species NOT DETECTED NOT DETECTED Final   Vancomycin resistance NOT DETECTED NOT DETECTED Final   Listeria monocytogenes NOT DETECTED NOT DETECTED Final   Staphylococcus species NOT DETECTED NOT DETECTED Final   Staphylococcus aureus NOT DETECTED NOT DETECTED Final   Methicillin resistance NOT DETECTED NOT DETECTED Final   Streptococcus species NOT DETECTED NOT DETECTED Final   Streptococcus agalactiae NOT DETECTED NOT DETECTED Final   Streptococcus pneumoniae NOT DETECTED NOT DETECTED Final   Streptococcus pyogenes NOT DETECTED NOT DETECTED Final   Acinetobacter baumannii NOT DETECTED NOT DETECTED Final   Enterobacteriaceae species NOT DETECTED NOT DETECTED Final   Enterobacter cloacae complex NOT DETECTED NOT DETECTED Final   Escherichia coli NOT DETECTED NOT DETECTED Final   Klebsiella oxytoca NOT DETECTED NOT DETECTED Final   Klebsiella pneumoniae NOT DETECTED NOT DETECTED Final   Proteus species NOT DETECTED NOT DETECTED Final   Serratia marcescens NOT DETECTED NOT DETECTED Final   Carbapenem resistance NOT DETECTED NOT DETECTED Final   Haemophilus influenzae NOT DETECTED NOT DETECTED Final   Neisseria meningitidis NOT DETECTED NOT DETECTED Final   Pseudomonas aeruginosa NOT DETECTED NOT DETECTED Final   Candida albicans NOT DETECTED NOT DETECTED Final   Candida glabrata NOT DETECTED NOT DETECTED Final   Candida krusei NOT DETECTED NOT DETECTED Final   Candida parapsilosis NOT DETECTED NOT DETECTED Final   Candida tropicalis NOT DETECTED NOT DETECTED Final  Culture, blood (Routine X 2) w Reflex to ID Panel     Status: None (Preliminary result)   Collection Time: 05/20/17  2:10 PM  Result Value Ref Range Status    Specimen Description BLOOD RIGHT ANTECUBITAL  Final   Special Requests   Final    BOTTLES DRAWN AEROBIC AND ANAEROBIC Blood Culture adequate volume   Culture  Setup Time   Final    GRAM NEGATIVE RODS ANAEROBIC BOTTLE ONLY CRITICAL VALUE NOTED.  VALUE IS CONSISTENT WITH PREVIOUSLY REPORTED AND CALLED VALUE.    Culture   Final    GRAM NEGATIVE RODS  CULTURE REINCUBATED FOR BETTER GROWTH    Report Status PENDING  Incomplete  MRSA PCR Screening     Status: None   Collection Time: 05/20/17  6:27 PM  Result Value Ref Range Status   MRSA by PCR NEGATIVE NEGATIVE Final    Comment:        The GeneXpert MRSA Assay (FDA approved for NASAL specimens only), is one component of a comprehensive MRSA colonization surveillance program. It is not intended to diagnose MRSA infection nor to guide or monitor treatment for MRSA infections.   Culture, blood (Routine x 2)     Status: Abnormal (Preliminary result)   Collection Time: 05/23/17  6:59 PM  Result Value Ref Range Status   Specimen Description BLOOD RIGHT ANTECUBITAL  Final   Special Requests   Final    BOTTLES DRAWN AEROBIC AND ANAEROBIC Blood Culture adequate volume   Culture  Setup Time   Final    GRAM POSITIVE COCCI IN CHAINS IN BOTH AEROBIC AND ANAEROBIC BOTTLES CRITICAL VALUE NOTED.  VALUE IS CONSISTENT WITH PREVIOUSLY REPORTED AND CALLED VALUE.    Culture (A)  Final    ENTEROCOCCUS FAECALIS SUSCEPTIBILITIES TO FOLLOW    Report Status PENDING  Incomplete  Culture, blood (Routine x 2)     Status: Abnormal (Preliminary result)   Collection Time: 05/23/17  7:05 PM  Result Value Ref Range Status   Specimen Description BLOOD LEFT ANTECUBITAL  Final   Special Requests   Final    BOTTLES DRAWN AEROBIC AND ANAEROBIC Blood Culture adequate volume   Culture  Setup Time   Final    GRAM POSITIVE COCCI IN CHAINS IN BOTH AEROBIC AND ANAEROBIC BOTTLES CRITICAL RESULT CALLED TO, READ BACK BY AND VERIFIED WITH: PHARMD N BATCHELDER [phone]  1359 MLM    Culture (A)  Final    ENTEROCOCCUS FAECALIS SUSCEPTIBILITIES TO FOLLOW    Report Status PENDING  Incomplete  Blood Culture ID Panel (Reflexed)     Status: Abnormal   Collection Time: 05/23/17  7:05 PM  Result Value Ref Range Status   Enterococcus species DETECTED (A) NOT DETECTED Final    Comment: CRITICAL RESULT CALLED TO, READ BACK BY AND VERIFIED WITH: PHARMD N BATCHELDER [phone] MLM    Vancomycin resistance NOT DETECTED NOT DETECTED Final   Listeria monocytogenes NOT DETECTED NOT DETECTED Final   Staphylococcus species NOT DETECTED NOT DETECTED Final   Staphylococcus aureus NOT DETECTED NOT DETECTED Final   Methicillin resistance NOT DETECTED NOT DETECTED Final   Streptococcus species NOT DETECTED NOT DETECTED Final   Streptococcus agalactiae NOT DETECTED NOT DETECTED Final   Streptococcus pneumoniae NOT DETECTED NOT DETECTED Final   Streptococcus pyogenes NOT DETECTED NOT DETECTED Final   Acinetobacter baumannii NOT DETECTED NOT DETECTED Final   Enterobacteriaceae species NOT DETECTED NOT DETECTED Final   Enterobacter cloacae complex NOT DETECTED NOT DETECTED Final   Escherichia coli NOT DETECTED NOT DETECTED Final   Klebsiella oxytoca NOT DETECTED NOT DETECTED Final   Klebsiella pneumoniae NOT DETECTED NOT DETECTED Final   Proteus species NOT DETECTED NOT DETECTED Final   Serratia marcescens NOT DETECTED NOT DETECTED Final   Carbapenem resistance NOT DETECTED NOT DETECTED Final   Haemophilus influenzae NOT DETECTED NOT DETECTED Final   Neisseria meningitidis NOT DETECTED NOT DETECTED Final   Pseudomonas aeruginosa NOT DETECTED NOT DETECTED Final   Candida albicans NOT DETECTED NOT DETECTED Final   Candida glabrata NOT DETECTED NOT DETECTED Final   Candida krusei NOT DETECTED NOT DETECTED  Final   Candida parapsilosis NOT DETECTED NOT DETECTED Final   Candida tropicalis NOT DETECTED NOT DETECTED Final  Culture, blood (Routine X 2) w Reflex to ID Panel      Status: None (Preliminary result)   Collection Time: 05/24/17  3:20 PM  Result Value Ref Range Status   Specimen Description BLOOD RIGHT HAND  Final   Special Requests   Final    BOTTLES DRAWN AEROBIC AND ANAEROBIC Blood Culture adequate volume   Culture NO GROWTH < 24 HOURS  Final   Report Status PENDING  Incomplete  Culture, blood (Routine X 2) w Reflex to ID Panel     Status: None (Preliminary result)   Collection Time: 05/24/17  3:30 PM  Result Value Ref Range Status   Specimen Description BLOOD LEFT HAND  Final   Special Requests   Final    BOTTLES DRAWN AEROBIC AND ANAEROBIC Blood Culture adequate volume   Culture NO GROWTH < 24 HOURS  Final   Report Status PENDING  Incomplete      Radiology Studies: Dg Chest 2 View  Result Date: 05/23/2017 CLINICAL DATA:  Back pain.  Possible sepsis. EXAM: CHEST  2 VIEW COMPARISON:  May 20, 2017 FINDINGS: The heart size and mediastinal contours are within normal limits. Both lungs are clear. The visualized skeletal structures are unremarkable. IMPRESSION: No active cardiopulmonary disease. Electronically Signed   By: Gerome Sam III M.D   On: 05/23/2017 19:35   Mr Thoracic Spine W Wo Contrast  Result Date: 05/24/2017 CLINICAL DATA:  46 y/o M; history of IV drug abuse presenting with lower extremity weakness and difficulty ambulating due to pain. Patient endorses some urinary incontinence. EXAM: MRI THORACIC AND LUMBAR SPINE WITHOUT AND WITH CONTRAST TECHNIQUE: Multiplanar and multiecho pulse sequences of the thoracic and lumbar spine were obtained without and with intravenous contrast. CONTRAST:  18mL MULTIHANCE GADOBENATE DIMEGLUMINE 529 MG/ML IV SOLN COMPARISON:  05/22/2017 CT lumbar spine. FINDINGS: MRI THORACIC SPINE FINDINGS Alignment:  Physiologic. Vertebrae: No evidence of fracture for discitis. No epidural collection. Multiple hemangioma the largest in the T5 vertebral body. Cord:  Normal signal and morphology.  No abnormal  enhancement. Paraspinal and other soft tissues: Negative. Disc levels: T3-4 small right subarticular disc protrusion contacts the right anterior cord without significant canal or foraminal stenosis. Otherwise no significant disc displacement, foraminal stenosis, or canal stenosis. Prominent ligamentum flavum hypertrophy at the T10-11 level. MRI LUMBAR SPINE FINDINGS Segmentation:  Standard. Alignment:  Physiologic. Vertebrae: Low signal within the opposing endplates at the L5-S1 level without significant edema or enhancement. This corresponds sclerosis on CT. There is enhancement and edema within the adjacent mid vertebral bodies (series 18, image 7) and there is a small fluid collection with marginal enhancement within the anterior disc space the (series 15, image 10 and series 18, image 10). Conus medullaris: Extends to the L1 level and appears normal. No abnormal enhancement. No epidural collection. Paraspinal and other soft tissues: Mild edema and enhancement within prevertebral soft tissues at the L5 and S1 levels. No paravertebral fluid collection. Disc levels: L1-2 through L4-5: No significant disc displacement, foraminal stenosis, or canal stenosis. L5-S1: Disc bulge and marginal osteophytes eccentric to the left. Mild right and moderate left foraminal stenosis. No significant canal stenosis. IMPRESSION: 1. Mild enhancement within the L5 and S1 vertebral bodies and small marginally enhancing fluid collection within the anterior disc space likely represents acute discitis or erosive osteoarthritis. Acute inflammation is superimposed on extensive chronic inflammatory changes which correspond  to sclerosis and eburnation on prior CT. 2. Mild prevertebral edema at L5-S1 is likely reactive. No paravertebral or epidural fluid collection identified. 3. Moderate disc bulge at L5-S1 eccentric to the left with moderate left and mild right foraminal stenosis. 4. Otherwise no significant foraminal or canal stenosis of  the thoracic and lumbar spine. Electronically Signed   By: Mitzi Hansen M.D.   On: 05/24/2017 03:46   Mr Lumbar Spine W Wo Contrast  Result Date: 05/24/2017 CLINICAL DATA:  46 y/o M; history of IV drug abuse presenting with lower extremity weakness and difficulty ambulating due to pain. Patient endorses some urinary incontinence. EXAM: MRI THORACIC AND LUMBAR SPINE WITHOUT AND WITH CONTRAST TECHNIQUE: Multiplanar and multiecho pulse sequences of the thoracic and lumbar spine were obtained without and with intravenous contrast. CONTRAST:  18mL MULTIHANCE GADOBENATE DIMEGLUMINE 529 MG/ML IV SOLN COMPARISON:  05/22/2017 CT lumbar spine. FINDINGS: MRI THORACIC SPINE FINDINGS Alignment:  Physiologic. Vertebrae: No evidence of fracture for discitis. No epidural collection. Multiple hemangioma the largest in the T5 vertebral body. Cord:  Normal signal and morphology.  No abnormal enhancement. Paraspinal and other soft tissues: Negative. Disc levels: T3-4 small right subarticular disc protrusion contacts the right anterior cord without significant canal or foraminal stenosis. Otherwise no significant disc displacement, foraminal stenosis, or canal stenosis. Prominent ligamentum flavum hypertrophy at the T10-11 level. MRI LUMBAR SPINE FINDINGS Segmentation:  Standard. Alignment:  Physiologic. Vertebrae: Low signal within the opposing endplates at the L5-S1 level without significant edema or enhancement. This corresponds sclerosis on CT. There is enhancement and edema within the adjacent mid vertebral bodies (series 18, image 7) and there is a small fluid collection with marginal enhancement within the anterior disc space the (series 15, image 10 and series 18, image 10). Conus medullaris: Extends to the L1 level and appears normal. No abnormal enhancement. No epidural collection. Paraspinal and other soft tissues: Mild edema and enhancement within prevertebral soft tissues at the L5 and S1 levels. No  paravertebral fluid collection. Disc levels: L1-2 through L4-5: No significant disc displacement, foraminal stenosis, or canal stenosis. L5-S1: Disc bulge and marginal osteophytes eccentric to the left. Mild right and moderate left foraminal stenosis. No significant canal stenosis. IMPRESSION: 1. Mild enhancement within the L5 and S1 vertebral bodies and small marginally enhancing fluid collection within the anterior disc space likely represents acute discitis or erosive osteoarthritis. Acute inflammation is superimposed on extensive chronic inflammatory changes which correspond to sclerosis and eburnation on prior CT. 2. Mild prevertebral edema at L5-S1 is likely reactive. No paravertebral or epidural fluid collection identified. 3. Moderate disc bulge at L5-S1 eccentric to the left with moderate left and mild right foraminal stenosis. 4. Otherwise no significant foraminal or canal stenosis of the thoracic and lumbar spine. Electronically Signed   By: Mitzi Hansen M.D.   On: 05/24/2017 03:46     Scheduled Meds: . insulin aspart  0-9 Units Subcutaneous TID WC  . [START ON 05/26/2017] insulin glargine  10 Units Subcutaneous Daily  . nicotine  21 mg Transdermal Daily   Continuous Infusions: . ceFEPime (MAXIPIME) IV 1 g (05/25/17 0626)  . vancomycin 1,000 mg (05/25/17 1252)     LOS: 1 day   Time Spent in minutes   30 minutes  Sylvan Sookdeo D.O. on 05/25/2017 at 2:24 PM  Between 7am to 7pm - Pager - [phone]  After 7pm go to [email or web address] - password [email or web address]  And look for the night coverage person covering for me after hours  [place]  [phone]

## 2017-05-25 NOTE — Progress Notes (Signed)
Pt having severe pain 10/10 after sitting up on side of bed. MD on call notified.

## 2017-05-26 DIAGNOSIS — E876 Hypokalemia: Secondary | ICD-10-CM

## 2017-05-26 LAB — CBC
HCT: 34.5 % — ABNORMAL LOW (ref 39.0–52.0)
Hemoglobin: 11.2 g/dL — ABNORMAL LOW (ref 13.0–17.0)
MCH: 28.2 pg (ref 26.0–34.0)
MCHC: 32.5 g/dL (ref 30.0–36.0)
MCV: 86.9 fL (ref 78.0–100.0)
PLATELETS: 165 10*3/uL (ref 150–400)
RBC: 3.97 MIL/uL — ABNORMAL LOW (ref 4.22–5.81)
RDW: 12.7 % (ref 11.5–15.5)
WBC: 7.6 10*3/uL (ref 4.0–10.5)

## 2017-05-26 LAB — BASIC METABOLIC PANEL
ANION GAP: 9 (ref 5–15)
BUN: 8 mg/dL (ref 6–20)
CALCIUM: 8.6 mg/dL — AB (ref 8.9–10.3)
CO2: 28 mmol/L (ref 22–32)
Chloride: 99 mmol/L — ABNORMAL LOW (ref 101–111)
Creatinine, Ser: 0.61 mg/dL (ref 0.61–1.24)
GFR calc Af Amer: >60 mL/min (ref >60)
GFR calc non Af Amer: >60 mL/min (ref >60)
Glucose, Bld: 283 mg/dL — ABNORMAL HIGH (ref 65–99)
Potassium: 3.4 mmol/L — ABNORMAL LOW (ref 3.5–5.1)
SODIUM: 136 mmol/L (ref 135–145)

## 2017-05-26 LAB — CULTURE, BLOOD (ROUTINE X 2)
SPECIAL REQUESTS: ADEQUATE
Special Requests: ADEQUATE

## 2017-05-26 LAB — GLUCOSE, CAPILLARY
GLUCOSE-CAPILLARY: 257 mg/dL — AB (ref 65–99)
GLUCOSE-CAPILLARY: 160 mg/dL — AB (ref 65–99)
Glucose-Capillary: 254 mg/dL — ABNORMAL HIGH (ref 65–99)
Glucose-Capillary: 189 mg/dL — ABNORMAL HIGH (ref 65–99)

## 2017-05-26 LAB — VANCOMYCIN, TROUGH: VANCOMYCIN TR: 8 ug/mL — AB (ref 15–20)

## 2017-05-26 MED ORDER — VANCOMYCIN HCL 10 G IV SOLR
1500.0000 mg | Freq: Three times a day (TID) | INTRAVENOUS | Status: DC
  Filled 2017-05-26: qty 1500

## 2017-05-26 MED ORDER — METRONIDAZOLE 500 MG PO TABS
500.0000 mg | ORAL_TABLET | Freq: Three times a day (TID) | ORAL | Status: DC
  Administered 2017-05-26 – 2017-05-28 (×7): 500 mg via ORAL
  Filled 2017-05-26 (×7): qty 1

## 2017-05-26 MED ORDER — SODIUM CHLORIDE 0.9 % IV SOLN
1500.0000 mg | Freq: Three times a day (TID) | INTRAVENOUS | Status: DC
  Administered 2017-05-26 – 2017-05-27 (×3): 1500 mg via INTRAVENOUS
  Filled 2017-05-26 (×5): qty 1500

## 2017-05-26 MED ORDER — POTASSIUM CHLORIDE CRYS ER 20 MEQ PO TBCR
40.0000 meq | EXTENDED_RELEASE_TABLET | Freq: Once | ORAL | Status: AC
  Administered 2017-05-26: 40 meq via ORAL
  Filled 2017-05-26: qty 2

## 2017-05-26 MED ORDER — HYDROMORPHONE HCL 1 MG/ML IJ SOLN
1.0000 mg | INTRAMUSCULAR | Status: DC | PRN
  Administered 2017-05-26 – 2017-05-28 (×8): 1 mg via INTRAVENOUS
  Filled 2017-05-26 (×8): qty 1

## 2017-05-26 MED ORDER — AMPICILLIN-SULBACTAM SODIUM 3 (2-1) G IJ SOLR
3.0000 g | Freq: Four times a day (QID) | INTRAMUSCULAR | Status: DC
  Filled 2017-05-26 (×2): qty 3

## 2017-05-26 MED ORDER — DIPHENHYDRAMINE HCL 50 MG/ML IJ SOLN: 25.0000 mg | Freq: Four times a day (QID) | INTRAMUSCULAR | Status: DC | PRN

## 2017-05-26 NOTE — Progress Notes (Signed)
Pharmacy Antibiotic Note  Elpidio GaleaJohn W Whitesel is a 46 y.o. male admitted on 05/23/2017 with diskitis and Enterococcal bacteremia.  Pharmacy has been consulted for Vancocin and cefepime dosing.    Vancomycin trough is subtherapeutic at 8 mcg/mL.  Patient is afebrile with WNL WBC.   Plan: Increase vanc to 1500mg  IV Q8H Cefepime 1gm IV Q8H.  ?D/C given no growth on 9/3 culture. Monitor renal fxn, micro data, vanc trough prior to 4th dose of new regimen   Temp (24hrs), Avg:98.2 F (36.8 C), Min:97.9 F (36.6 C), Max:98.6 F (37 C)   Recent Labs Lab 05/20/17 1203 05/20/17 1217 05/22/17 0250 05/23/17 1859 05/23/17 1909 05/23/17 2300 05/24/17 0512 05/26/17 0346 05/26/17 1121  WBC 15.5*  --  9.7 11.3*  --   --  10.1 7.6  --   CREATININE 0.94  --  0.78 0.73  --   --  0.59* 0.61  --   LATICACIDVEN  --  1.49  --   --  1.32 1.34  --   --   --   VANCOTROUGH  --   --   --   --   --   --   --   --  8*    Estimated Creatinine Clearance: 124.2 mL/min (by C-G formula based on SCr of 0.61 mg/dL).    Allergies  Allergen Reactions  . Bee Venom Anaphylaxis, Shortness Of Breath and Swelling  . Penicillins Hives    Has patient had a PCN reaction causing immediate rash, facial/tongue/throat swelling, SOB or lightheadedness with hypotension: Yes Has patient had a PCN reaction causing severe rash involving mucus membranes or skin necrosis: Unknown Has patient had a PCN reaction that required hospitalization: No (was already IN the hosp) Has patient had a PCN reaction occurring within the last 10 years: No If all of the above answers are "NO", then may proceed with Cephalosporin use.     Vanc 9/7 >> Cefepime 9/7 >>  9/9 VT = 8 mcg/mL on 1g q8 >> 1500mg  q8  9/3 BCx - GVR / GNR (BCID negative) >> final, unable to identify further 9/6 BCx - E.faecalis, pan sensitive (BCID Enterococcus)  9/7 BCx - NGTD   Adam Demary D. Laney Potashang, PharmD, BCPS Pager:  [phone] 05/26/2017, 1:07 PM

## 2017-05-26 NOTE — Progress Notes (Addendum)
      INFECTIOUS DISEASE ATTENDING ADDENDUM:   Date: 05/26/2017  Patient name: Dan [name]  Medical record number: [id number]  Date of birth: 12-Mar-1971    GVR, GNR = ANEROBE still not ID  His Enterococcus is AMP S  I am narrowing to unasyn  ADDENDUM:  Pt refused to trial unasyn given his PCN allergy.  Therefore will switch to IV vancomycin and po flagyl (to cover the anerobe  Dr. Ninetta LightsHatcher is back tomorrow.    Dan [name] 05/26/2017, 1:08 PM

## 2017-05-26 NOTE — Progress Notes (Signed)
PROGRESS NOTE    Dan Dan [name]  ZOX:[id number] DOB: 28-Sep-1970 DOA: 05/23/2017 PCP: Patient, No Pcp Per   Chief Complaint  Patient presents with  . Back Pain    Brief Narrative:  HPI On 05/24/2017 by Dr. Rikki SpearingXilin Niu Ledford W Powellis a 46 y.o.malewith medical history significant of diabetes mellitus, IV drug use, methamphetamine abuse, tobacco abuse, medication noncompliance, who presents with lower back pain and a urinary incontinence.  Patient states that he has been having lower back pain for about 10 days, which has been progressively getting worse. His back pain is constant, 10 out of 10 in severity, sharp, radiating to left leg posteriorly. He has intermittent urinary incontinence. He denies weakness in the legs. No fever or chills. Patient does not have chest pain, shortness breath, cough, nausea, vomiting, diarrhea, abdominal pain, symptoms of UTI. No headache, photophobia, neck rigidity. [facility] and he refused to have an MRI performed. He ended up having positive blood cultures for Grewgram-negative rods, and was called back today.   Assessment & Plan   Discitis of the lumbosacral region/bacteremia/possible osteomyelitis/bacteremia -MRI concerning for discitis of the L5-S1 region -Currently hemodynamically stable -Blood cultures 1/2 from 05/20/2017 showed gram variable rod -Blood culture 05/23/2017 detected enterococcus faecalis, Susceptibilities pending -Blood cultures from 05/24/2017 show no growth to date -Neurosurgery consultation appreciated, recommended IR for disc aspiration -Discussed with interventional radiology, does not feel patient needs aspiration, continue antibiotic treatment -Echocardiogram EF 55-60%, no RWMA -Infectious disease consulted and appreciated -Continue pain control- will change IV pain med to dilaudid as morphine has not helped -was placed on vancomycin/cefepime- transitioned to unasyn  Diabetes mellitus, type II  complicated by hyperglycemia -Hemoglobin A1c 11.5 05/20/2017 -Patient does not take any medications at home -Presented with blood sugar of 433 -Continue insulin sliding scale, Lantus, CBG monitoring -Will increase Lantus to 12 units daily  Polysubstance abuse  -Toxicology screen positive for amphetamines (patient has no home medications) -Discussed cessation from all illicit drugs as well as tobacco use -Continue nicotine patch  Normocytic anemia -Hemoglobin currently 11.2, baseline approximately 12-14 -Continue to monitor CBC  Hypokalemia -Possibly due to insulin, will replace and continue to monitor BMP  DVT Prophylaxis  SCDs  Code Status: Full  Family Communication: None at bedside  Disposition Plan:  Admitted.   Consultants Neurourgery Interventional radiology Infectious disease  Procedures  Echocardiogram   Antibiotics   Anti-infectives    Start     Dose/Rate Route Frequency Ordered Stop   05/26/17 1400  vancomycin (VANCOCIN) 1,500 mg in sodium chloride 0.9 % 500 mL IVPB  Status:  Discontinued     1,500 mg 250 mL/hr over 120 Minutes Intravenous Every 8 hours 05/26/17 1308 05/26/17 1308   05/26/17 1400  Ampicillin-Sulbactam (UNASYN) 3 g in sodium chloride 0.9 % 100 mL IVPB  Status:  Discontinued     3 g 200 mL/hr over 30 Minutes Intravenous Every 6 hours 05/26/17 1315 05/26/17 1324   05/26/17 1400  vancomycin (VANCOCIN) 1,500 mg in sodium chloride 0.9 % 500 mL IVPB     1,500 mg 250 mL/hr over 120 Minutes Intravenous Every 8 hours 05/26/17 1324     05/26/17 1400  metroNIDAZOLE (FLAGYL) tablet 500 mg     500 mg Oral Every 8 hours 05/26/17 1324     05/24/17 1400  ceFEPIme (MAXIPIME) 1 g in dextrose 5 % 50 mL IVPB  Status:  Discontinued     1 g 100 mL/hr over 30 Minutes Intravenous  Every 8 hours 05/24/17 0503 05/26/17 1308   05/24/17 1200  vancomycin (VANCOCIN) IVPB 1000 mg/200 mL premix  Status:  Discontinued     1,000 mg 200 mL/hr over 60 Minutes  Intravenous Every 8 hours 05/24/17 0503 05/26/17 1308   05/24/17 0400  vancomycin (VANCOCIN) 1,500 mg in sodium chloride 0.9 % 500 mL IVPB     1,500 mg 250 mL/hr over 120 Minutes Intravenous  Once 05/24/17 0353 05/24/17 0706   05/24/17 0400  ceFEPIme (MAXIPIME) 2 g in dextrose 5 % 50 mL IVPB     2 g 100 mL/hr over 30 Minutes Intravenous  Once 05/24/17 0353 05/24/17 0455      Subjective:   Dan [name] seen and examined today.  Continues to complain of back pain feels IV and oral medications are not helping much. States they helped for approximately 2 hours. Denies chest pain, transplant, abdominal pain, nausea vomiting, diarrhea or constipation, dizziness or headache.  Objective:   Vitals:   05/25/17 0632 05/25/17 1549 05/25/17 2000 05/26/17 0503  BP: 116/69 128/82 129/65 125/70  Pulse: 79 84 84 79  Resp: Temp: 98.6 F (37 C) 97.9 F (36.6 C) 98.6 F (37 C) 98.1 F (36.7 C)  TempSrc: Oral Oral Oral Oral  SpO2: 99% 98% 98% 98%  Weight:      Height:        Intake/Output Summary (Last 24 hours) at 05/26/17 1438 Last data filed at 05/26/17 1400  Gross per 24 hour  Intake             1690 ml  Output             1200 ml  Net              490 ml   Filed Weights   05/24/17 0645  Weight: 86.2 kg (190 lb 0.6 oz)   Exam  General: Well developed, well nourished, NAD, appears stated age  HEENT: NCAT, mucous membranes moist.   Cardiovascular: S1 S2 auscultated, RRR, no murmurs  Respiratory: Clear to auscultation bilaterally with equal chest rise  Abdomen: Soft, nontender, nondistended, + bowel sounds  Extremities: warm dry without cyanosis clubbing or edema  Neuro: AAOx3, nonfocal  Psych: appropriate mood and affect  Data Reviewed: I have personally reviewed following labs and imaging studies  CBC:  Recent Labs Lab 05/20/17 1203 05/22/17 0250 05/23/17 1859 05/24/17 0512 05/26/17 0346  WBC 15.5* 9.7 11.3* 10.1 7.6  NEUTROABS 11.6* 7.3 9.6*  --    --   HGB 12.8* 12.4* 12.6* 11.9* 11.2*  HCT 37.6* 37.2* 37.7* 36.1* 34.5*  MCV 87.0 87.7 86.5 87.4 86.9  PLT 130* 123* 141* 143* 165   Basic Metabolic Panel:  Recent Labs Lab 05/20/17 1203 05/22/17 0250 05/23/17 1859 05/24/17 0512 05/26/17 0346  NA 138 136 136 137 136  K 3.8 3.5 3.9 3.7 3.4*  CL 106 104 103 106 99*  CO2 20* GLUCOSE 322* 401* 477* 289* 283*  BUN CREATININE 0.94 0.78 0.73 0.59* 0.61  CALCIUM 8.5* 8.4* 8.6* 8.5* 8.6*   GFR: Estimated Creatinine Clearance: 124.2 mL/min (by C-G formula based on SCr of 0.61 mg/dL). Liver Function Tests:  Recent Labs Lab 05/20/17 1203 05/23/17 1859  AST 24 20  ALT 24 15*  ALKPHOS 50 65  BILITOT 0.6 0.8  PROT 6.8 6.4*  ALBUMIN 3.9 3.1*    Recent Labs Lab 05/20/17 1203  LIPASE 24    Recent Labs Lab 05/20/17 1410  AMMONIA 18   Coagulation Profile:  Recent Labs Lab 05/23/17 1859  INR 0.92   Cardiac Enzymes:  Recent Labs Lab 05/20/17 1203 05/20/17 1410 05/20/17 1641 05/20/17 2018  CKTOTAL 419*  --   --   --   TROPONINI  --  <0.03 <0.03 <0.03   BNP (last 3 results) No results for input(s): PROBNP in the last 8760 hours. HbA1C: No results for input(s): HGBA1C in the last 72 hours. CBG:  Recent Labs Lab 05/25/17 1134 05/25/17 1633 05/25/17 2043 05/26/17 0631 05/26/17 1139  GLUCAP 248* 283* 186* 254* 189*   Lipid Profile: No results for input(s): CHOL, HDL, LDLCALC, TRIG, CHOLHDL, LDLDIRECT in the last 72 hours. Thyroid Function Tests: No results for input(s): TSH, T4TOTAL, FREET4, T3FREE, THYROIDAB in the last 72 hours. Anemia Panel: No results for input(s): VITAMINB12, FOLATE, FERRITIN, TIBC, IRON, RETICCTPCT in the last 72 hours. Urine analysis:    Component Value Date/Time   COLORURINE YELLOW 05/23/2017 2306   APPEARANCEUR CLEAR 05/23/2017 2306   LABSPEC 1.035 (H) 05/23/2017 2306   PHURINE 5.0 05/23/2017 2306   GLUCOSEU >=500 (A) 05/23/2017 2306   HGBUR  NEGATIVE 05/23/2017 2306   BILIRUBINUR NEGATIVE 05/23/2017 2306   KETONESUR 5 (A) 05/23/2017 2306   PROTEINUR NEGATIVE 05/23/2017 2306   UROBILINOGEN 2.0 (H) 07/30/2015 2258   NITRITE NEGATIVE 05/23/2017 2306   LEUKOCYTESUR NEGATIVE 05/23/2017 2306   Sepsis Labs: (procalcitonin:4,lacticidven:4)  ) Recent Results (from the past 240 hour(s))  Culture, blood (Routine X 2) w Reflex to ID Panel     Status: Abnormal   Collection Time: 05/20/17  2:05 PM  Result Value Ref Range Status   Specimen Description BLOOD RIGHT HAND  Final   Special Requests   Final    BOTTLES DRAWN AEROBIC AND ANAEROBIC Blood Culture adequate volume   Culture  Setup Time (A)  Final    GRAM VARIABLE ROD ANAEROBIC BOTTLE ONLY CRITICAL RESULT CALLED TO, READ BACK BY AND VERIFIED WITH: S COBLE RN 2303 05/22/17 A BROWNING    Culture (A)  Final    ANAEROBIC GRAM POSITIVE RODS UNABLE TO FURTHER IDENTIFY    Report Status 05/25/2017 FINAL  Final  Blood Culture ID Panel (Reflexed)     Status: None   Collection Time: 05/20/17  2:05 PM  Result Value Ref Range Status   Enterococcus species NOT DETECTED NOT DETECTED Final   Vancomycin resistance NOT DETECTED NOT DETECTED Final   Listeria monocytogenes NOT DETECTED NOT DETECTED Final   Staphylococcus species NOT DETECTED NOT DETECTED Final   Staphylococcus aureus NOT DETECTED NOT DETECTED Final   Methicillin resistance NOT DETECTED NOT DETECTED Final   Streptococcus species NOT DETECTED NOT DETECTED Final   Streptococcus agalactiae NOT DETECTED NOT DETECTED Final   Streptococcus pneumoniae NOT DETECTED NOT DETECTED Final   Streptococcus pyogenes NOT DETECTED NOT DETECTED Final   Acinetobacter baumannii NOT DETECTED NOT DETECTED Final   Enterobacteriaceae species NOT DETECTED NOT DETECTED Final   Enterobacter cloacae complex NOT DETECTED NOT DETECTED Final   Escherichia coli NOT DETECTED NOT DETECTED Final   Klebsiella oxytoca NOT DETECTED NOT DETECTED Final    Klebsiella pneumoniae NOT DETECTED NOT DETECTED Final   Proteus species NOT DETECTED NOT DETECTED Final   Serratia marcescens NOT DETECTED NOT DETECTED Final   Carbapenem resistance NOT DETECTED NOT DETECTED Final   Haemophilus influenzae NOT DETECTED NOT DETECTED Final   Neisseria meningitidis NOT DETECTED NOT DETECTED  Final   Pseudomonas aeruginosa NOT DETECTED NOT DETECTED Final   Candida albicans NOT DETECTED NOT DETECTED Final   Candida glabrata NOT DETECTED NOT DETECTED Final   Candida krusei NOT DETECTED NOT DETECTED Final   Candida parapsilosis NOT DETECTED NOT DETECTED Final   Candida tropicalis NOT DETECTED NOT DETECTED Final  Culture, blood (Routine X 2) w Reflex to ID Panel     Status: None (Preliminary result)   Collection Time: 05/20/17  2:10 PM  Result Value Ref Range Status   Specimen Description BLOOD RIGHT ANTECUBITAL  Final   Special Requests   Final    BOTTLES DRAWN AEROBIC AND ANAEROBIC Blood Culture adequate volume   Culture  Setup Time   Final    GRAM NEGATIVE RODS ANAEROBIC BOTTLE ONLY CRITICAL VALUE NOTED.  VALUE IS CONSISTENT WITH PREVIOUSLY REPORTED AND CALLED VALUE.    Culture   Final    GRAM NEGATIVE RODS HOLDING FOR POSSIBLE ANAEROBE    Report Status PENDING  Incomplete  MRSA PCR Screening     Status: None   Collection Time: 05/20/17  6:27 PM  Result Value Ref Range Status   MRSA by PCR NEGATIVE NEGATIVE Final    Comment:        The GeneXpert MRSA Assay (FDA approved for NASAL specimens only), is one component of a comprehensive MRSA colonization surveillance program. It is not intended to diagnose MRSA infection nor to guide or monitor treatment for MRSA infections.   Culture, blood (Routine x 2)     Status: Abnormal   Collection Time: 05/23/17  6:59 PM  Result Value Ref Range Status   Specimen Description BLOOD RIGHT ANTECUBITAL  Final   Special Requests   Final    BOTTLES DRAWN AEROBIC AND ANAEROBIC Blood Culture adequate volume    Culture  Setup Time   Final    GRAM POSITIVE COCCI IN CHAINS IN BOTH AEROBIC AND ANAEROBIC BOTTLES CRITICAL VALUE NOTED.  VALUE IS CONSISTENT WITH PREVIOUSLY REPORTED AND CALLED VALUE.    Culture (A)  Final    ENTEROCOCCUS FAECALIS SUSCEPTIBILITIES PERFORMED ON PREVIOUS CULTURE WITHIN THE LAST 5 DAYS.    Report Status 05/26/2017 FINAL  Final  Culture, blood (Routine x 2)     Status: Abnormal   Collection Time: 05/23/17  7:05 PM  Result Value Ref Range Status   Specimen Description BLOOD LEFT ANTECUBITAL  Final   Special Requests   Final    BOTTLES DRAWN AEROBIC AND ANAEROBIC Blood Culture adequate volume   Culture  Setup Time   Final    GRAM POSITIVE COCCI IN CHAINS IN BOTH AEROBIC AND ANAEROBIC BOTTLES CRITICAL RESULT CALLED TO, READ BACK BY AND VERIFIED WITH: Corliss Parish [id number] MLM    Culture ENTEROCOCCUS FAECALIS (A)  Final   Report Status 05/26/2017 FINAL  Final   Organism ID, Bacteria ENTEROCOCCUS FAECALIS  Final      Susceptibility   Enterococcus faecalis - MIC*    AMPICILLIN <=2 SENSITIVE Sensitive     VANCOMYCIN 1 SENSITIVE Sensitive     GENTAMICIN SYNERGY SENSITIVE Sensitive     * ENTEROCOCCUS FAECALIS  Blood Culture ID Panel (Reflexed)     Status: Abnormal   Collection Time: 05/23/17  7:05 PM  Result Value Ref Range Status   Enterococcus species DETECTED (A) NOT DETECTED Final    Comment: CRITICAL RESULT CALLED TO, READ BACK BY AND VERIFIED WITH: PHARMD N BATCHELDER [id number] MLM    Vancomycin resistance NOT DETECTED NOT DETECTED  Final   Listeria monocytogenes NOT DETECTED NOT DETECTED Final   Staphylococcus species NOT DETECTED NOT DETECTED Final   Staphylococcus aureus NOT DETECTED NOT DETECTED Final   Methicillin resistance NOT DETECTED NOT DETECTED Final   Streptococcus species NOT DETECTED NOT DETECTED Final   Streptococcus agalactiae NOT DETECTED NOT DETECTED Final   Streptococcus pneumoniae NOT DETECTED NOT DETECTED Final   Streptococcus  pyogenes NOT DETECTED NOT DETECTED Final   Acinetobacter baumannii NOT DETECTED NOT DETECTED Final   Enterobacteriaceae species NOT DETECTED NOT DETECTED Final   Enterobacter cloacae complex NOT DETECTED NOT DETECTED Final   Escherichia coli NOT DETECTED NOT DETECTED Final   Klebsiella oxytoca NOT DETECTED NOT DETECTED Final   Klebsiella pneumoniae NOT DETECTED NOT DETECTED Final   Proteus species NOT DETECTED NOT DETECTED Final   Serratia marcescens NOT DETECTED NOT DETECTED Final   Carbapenem resistance NOT DETECTED NOT DETECTED Final   Haemophilus influenzae NOT DETECTED NOT DETECTED Final   Neisseria meningitidis NOT DETECTED NOT DETECTED Final   Pseudomonas aeruginosa NOT DETECTED NOT DETECTED Final   Candida albicans NOT DETECTED NOT DETECTED Final   Candida glabrata NOT DETECTED NOT DETECTED Final   Candida krusei NOT DETECTED NOT DETECTED Final   Candida parapsilosis NOT DETECTED NOT DETECTED Final   Candida tropicalis NOT DETECTED NOT DETECTED Final  Culture, blood (Routine X 2) w Reflex to ID Panel     Status: None (Preliminary result)   Collection Time: 05/24/17  3:20 PM  Result Value Ref Range Status   Specimen Description BLOOD RIGHT HAND  Final   Special Requests   Final    BOTTLES DRAWN AEROBIC AND ANAEROBIC Blood Culture adequate volume   Culture NO GROWTH 2 DAYS  Final   Report Status PENDING  Incomplete  Culture, blood (Routine X 2) w Reflex to ID Panel     Status: None (Preliminary result)   Collection Time: 05/24/17  3:30 PM  Result Value Ref Range Status   Specimen Description BLOOD LEFT HAND  Final   Special Requests   Final    BOTTLES DRAWN AEROBIC AND ANAEROBIC Blood Culture adequate volume   Culture NO GROWTH 2 DAYS  Final   Report Status PENDING  Incomplete      Radiology Studies: No results found.   Scheduled Meds: . insulin aspart  0-9 Units Subcutaneous TID WC  . insulin glargine  10 Units Subcutaneous Daily  . metroNIDAZOLE  500 mg Oral Q8H    . nicotine  21 mg Transdermal Daily   Continuous Infusions: . vancomycin       LOS: 2 days   Time Spent in minutes   30 minutes  Bentley Fissel D.O. on 05/26/2017 at 2:38 PM  Between 7am to 7pm - Pager - [phone]  After 7pm go to [email or web address] - password [email or web address]  And look for the night coverage person covering for me after hours  [facility] Office  [phone]

## 2017-05-27 DIAGNOSIS — F1990 Other psychoactive substance use, unspecified, uncomplicated: Secondary | ICD-10-CM

## 2017-05-27 LAB — BASIC METABOLIC PANEL
Anion gap: 8 (ref 5–15)
BUN: 7 mg/dL (ref 6–20)
CHLORIDE: 99 mmol/L — AB (ref 101–111)
CO2: 30 mmol/L (ref 22–32)
CREATININE: 0.53 mg/dL — AB (ref 0.61–1.24)
Calcium: 8.6 mg/dL — ABNORMAL LOW (ref 8.9–10.3)
GFR calc non Af Amer: >60 mL/min (ref >60)
Glucose, Bld: 188 mg/dL — ABNORMAL HIGH (ref 65–99)
Potassium: 3.6 mmol/L (ref 3.5–5.1)
Sodium: 137 mmol/L (ref 135–145)

## 2017-05-27 LAB — GLUCOSE, CAPILLARY
GLUCOSE-CAPILLARY: 231 mg/dL — AB (ref 65–99)
GLUCOSE-CAPILLARY: 201 mg/dL — AB (ref 65–99)
GLUCOSE-CAPILLARY: 176 mg/dL — AB (ref 65–99)
Glucose-Capillary: 406 mg/dL — ABNORMAL HIGH (ref 65–99)

## 2017-05-27 LAB — VANCOMYCIN, TROUGH: Vancomycin Tr: 11 ug/mL — ABNORMAL LOW (ref 15–20)

## 2017-05-27 MED ORDER — DIPHENHYDRAMINE HCL 25 MG PO CAPS
25.0000 mg | ORAL_CAPSULE | Freq: Once | ORAL | Status: AC
  Administered 2017-05-27: 25 mg via ORAL
  Filled 2017-05-27: qty 1

## 2017-05-27 MED ORDER — DIPHENHYDRAMINE HCL 25 MG PO CAPS: 25.0000 mg | ORAL_CAPSULE | Freq: Four times a day (QID) | ORAL | Status: DC | PRN

## 2017-05-27 MED ORDER — INSULIN GLARGINE 100 UNIT/ML ~~LOC~~ SOLN
12.0000 [IU] | Freq: Every day | SUBCUTANEOUS | Status: DC
  Administered 2017-05-28: 12 [IU] via SUBCUTANEOUS
  Filled 2017-05-27: qty 0.12

## 2017-05-27 MED ORDER — AMOXICILLIN-POT CLAVULANATE 875-125 MG PO TABS
1.0000 | ORAL_TABLET | Freq: Once | ORAL | Status: AC
Start: 2017-05-27 — End: 2017-05-27
  Administered 2017-05-27: 1 via ORAL
  Filled 2017-05-27: qty 1

## 2017-05-27 MED ORDER — DIPHENHYDRAMINE HCL 50 MG/ML IJ SOLN: 12.5000 mg | Freq: Four times a day (QID) | INTRAMUSCULAR | Status: DC | PRN

## 2017-05-27 MED ORDER — VANCOMYCIN HCL 10 G IV SOLR
1750.0000 mg | Freq: Three times a day (TID) | INTRAVENOUS | Status: DC
  Administered 2017-05-27 – 2017-05-28 (×3): 1750 mg via INTRAVENOUS
  Filled 2017-05-27 (×5): qty 1750

## 2017-05-27 NOTE — Evaluation (Signed)
Physical Therapy Evaluation Patient Details Name: Dan [name] MRN: [id number] DOB: 21-Apr-1971 Today's Date: 05/27/2017   History of Present Illness  46 y.o. male with medical history significant of diabetes mellitus, IV drug use, methamphetamine abuse, tobacco abuse, medication noncompliance, admitted with lower back pain, urinary incontinence- discitis  Clinical Impression  Pt very pleasant and although hesitant for mobility willing to mobilize and initiate gait. Pt educated for back precautions to assist with pain management and mobility. Pt with decreased strength, function, gait and transfers who will benefit from acute therapy to maximize mobility, gait and independence to decrease burden of care. Pt educated for sitting short periods of time and encouraged to be OOB for all meals.     Follow Up Recommendations Home health PT    Equipment Recommendations  Rolling walker with 5" wheels;3in1 (PT)    Recommendations for Other Services OT consult     Precautions / Restrictions Precautions Precautions: Fall;Back Precaution Booklet Issued: Yes (comment) Precaution Comments: back for pain management Restrictions Weight Bearing Restrictions: No      Mobility  Bed Mobility Overal bed mobility: Needs Assistance Bed Mobility: Rolling;Sidelying to Sit Rolling: Min guard Sidelying to sit: Min assist       General bed mobility comments: cues for sequence with assist to elevate trunk   Transfers Overall transfer level: Needs assistance   Transfers: Sit to/from Stand Sit to Stand: Min guard         General transfer comment: cues for posture and safety  Ambulation/Gait Ambulation/Gait assistance: Min guard Ambulation Distance (Feet): 25 Feet Assistive device: Rolling walker (2 wheeled) Gait Pattern/deviations: Step-through pattern;Decreased stride length;Trunk flexed   Gait velocity interpretation: Below normal speed for age/gender General Gait Details: pt with slow  cautious gait with increased time for stepping and slight trunk flexion with cues for posture, position in RW and safety. Pt ambulated to and from bathroom with standing rest to void  Stairs            Wheelchair Mobility    Modified Rankin (Stroke Patients Only)       Balance Overall balance assessment: No apparent balance deficits (not formally assessed)                                           Pertinent Vitals/Pain Pain Assessment: 0-10 Pain Score: 8  Pain Location: lower back Pain Descriptors / Indicators: Throbbing Pain Intervention(s): Limited activity within patient's tolerance;Repositioned;Monitored during session;Premedicated before session;Patient requesting pain meds-RN notified    Home Living Family/patient expects to be discharged to:: Private residence Living Arrangements: Spouse/significant other;Children Available Help at Discharge: Family;Available PRN/intermittently Type of Home: House Home Access: Stairs to enter   Entrance Stairs-Number of Steps: 3 Home Layout: One level Home Equipment: None      Prior Function Level of Independence: Independent         Comments: works as a Teacher, English as a foreign language Extremity Assessment Upper Extremity Assessment: Overall WFL for tasks assessed    Lower Extremity Assessment Lower Extremity Assessment: Generalized weakness (unable to fully assess due to pain)    Cervical / Trunk Assessment Cervical / Trunk Assessment: Other exceptions (slight flexion with extreme guarding due to pain) Cervical / Trunk Exceptions: slight flexion with extreme guarding due to pain  Communication   Communication:  No difficulties  Cognition Arousal/Alertness: Awake/alert Behavior During Therapy: WFL for tasks assessed/performed Overall Cognitive Status: Within Functional Limits for tasks assessed                                         General Comments      Exercises     Assessment/Plan    PT Assessment Patient needs continued PT services  PT Problem List Decreased mobility;Decreased range of motion;Decreased knowledge of precautions;Decreased activity tolerance;Decreased knowledge of use of DME;Pain       PT Treatment Interventions Gait training;Therapeutic exercise;Patient/family education;Stair training;Functional mobility training;Therapeutic activities;DME instruction    PT Goals (Current goals can be found in the Care Plan section)  Acute Rehab PT Goals Patient Stated Goal: return to work PT Goal Formulation: With patient Time For Goal Achievement: 06/10/17 Potential to Achieve Goals: Fair    Frequency Min 3X/week   Barriers to discharge Decreased caregiver support      Co-evaluation               AM-PAC PT "6 Clicks" Daily Activity  Outcome Measure Difficulty turning over in bed (including adjusting bedclothes, sheets and blankets)?: A Lot Difficulty moving from lying on back to sitting on the side of the bed? : Unable Difficulty sitting down on and standing up from a chair with arms (e.g., wheelchair, bedside commode, etc,.)?: A Lot Help needed moving to and from a bed to chair (including a wheelchair)?: A Little Help needed walking in hospital room?: A Little Help needed climbing 3-5 steps with a railing? : A Lot 6 Click Score: 13    End of Session Equipment Utilized During Treatment: Gait belt Activity Tolerance: Patient limited by pain Patient left: in chair;with call bell/phone within reach;Other (comment) (chair alarm in place but no box to set alarm) Nurse Communication: Mobility status;Precautions PT Visit Diagnosis: Other abnormalities of gait and mobility (R26.89);Difficulty in walking, not elsewhere classified (R26.2)    Time: 1325-1402 PT Time Calculation (min) (ACUTE ONLY): 37 min   Charges:   PT Evaluation $PT Eval Moderate Complexity: 1 Mod PT  Treatments $Therapeutic Activity: 8-22 mins   PT G Codes:        Dan MeigsMaija Tabor [name], PT [phone]   Dan [name] 05/27/2017, 2:24 PM

## 2017-05-27 NOTE — Progress Notes (Addendum)
INFECTIOUS DISEASE PROGRESS NOTE  ID: [name] is a 46 y.o. male with  Principal Problem:   Discitis of lumbosacral region Active Problems:   Diabetes mellitus without complication (HCC)   IVDU (intravenous drug user)   Back pain   Methamphetamine abuse   Tobacco abuse   Bacteremia due to Gram-negative bacteria  Subjective: C/o back pain  Abtx:  Anti-infectives    Start     Dose/Rate Route Frequency Ordered Stop   05/27/17 1600  vancomycin (VANCOCIN) 1,750 mg in sodium chloride 0.9 % 500 mL IVPB     1,750 mg 250 mL/hr over 120 Minutes Intravenous Every 8 hours 05/27/17 1521     05/26/17 1400  vancomycin (VANCOCIN) 1,500 mg in sodium chloride 0.9 % 500 mL IVPB  Status:  Discontinued     1,500 mg 250 mL/hr over 120 Minutes Intravenous Every 8 hours 05/26/17 1308 05/26/17 1308   05/26/17 1400  Ampicillin-Sulbactam (UNASYN) 3 g in sodium chloride 0.9 % 100 mL IVPB  Status:  Discontinued     3 g 200 mL/hr over 30 Minutes Intravenous Every 6 hours 05/26/17 1315 05/26/17 1324   05/26/17 1400  vancomycin (VANCOCIN) 1,500 mg in sodium chloride 0.9 % 500 mL IVPB  Status:  Discontinued     1,500 mg 250 mL/hr over 120 Minutes Intravenous Every 8 hours 05/26/17 1324 05/27/17 1521   05/26/17 1400  metroNIDAZOLE (FLAGYL) tablet 500 mg     500 mg Oral Every 8 hours 05/26/17 1324     05/24/17 1400  ceFEPIme (MAXIPIME) 1 g in dextrose 5 % 50 mL IVPB  Status:  Discontinued     1 g 100 mL/hr over 30 Minutes Intravenous Every 8 hours 05/24/17 0503 05/26/17 1308   05/24/17 1200  vancomycin (VANCOCIN) IVPB 1000 mg/200 mL premix  Status:  Discontinued     1,000 mg 200 mL/hr over 60 Minutes Intravenous Every 8 hours 05/24/17 0503 05/26/17 1308   05/24/17 0400  vancomycin (VANCOCIN) 1,500 mg in sodium chloride 0.9 % 500 mL IVPB     1,500 mg 250 mL/hr over 120 Minutes Intravenous  Once 05/24/17 0353 05/24/17 0706   05/24/17 0400  ceFEPIme (MAXIPIME) 2 g in dextrose 5 % 50 mL IVPB     2  g 100 mL/hr over 30 Minutes Intravenous  Once 05/24/17 0353 05/24/17 0455      Medications:  Scheduled: . insulin aspart  0-9 Units Subcutaneous TID WC  . [START ON 05/28/2017] insulin glargine  12 Units Subcutaneous Daily  . metroNIDAZOLE  500 mg Oral Q8H  . nicotine  21 mg Transdermal Daily    Objective: Vital signs in last 24 hours: Temp:  [98 F (36.7 C)-98.5 F (36.9 C)] 98 F (36.7 C) (09/10 1500) Pulse Rate:  [75-84] 84 (09/10 1500) Resp:  [18] 18 (09/10 1500) BP: (117-142)/(60-79) 126/75 (09/10 1500) SpO2:  [97 %-98 %] 98 % (09/10 1500)   General appearance: alert, cooperative and no distress Resp: clear to auscultation bilaterally Cardio: regular rate and rhythm GI: normal findings: bowel sounds normal and soft, non-tender  Lab Results  Recent Labs  05/26/17 0346 05/27/17 0230  WBC 7.6  --   HGB 11.2*  --   HCT 34.5*  --   NA 136 137  K 3.4* 3.6  CL 99* 99*  CO2 28 30  BUN 8 7  CREATININE 0.61 0.53*   Liver Panel No results for input(s): PROT, ALBUMIN, AST, ALT, ALKPHOS, BILITOT, BILIDIR, IBILI in  the last 72 hours. Sedimentation Rate No results for input(s): ESRSEDRATE in the last 72 hours. C-Reactive Protein No results for input(s): CRP in the last 72 hours.  Microbiology: Recent Results (from the past 240 hour(s))  Culture, blood (Routine X 2) w Reflex to ID Panel     Status: Abnormal   Collection Time: 05/20/17  2:05 PM  Result Value Ref Range Status   Specimen Description BLOOD RIGHT HAND  Final   Special Requests   Final    BOTTLES DRAWN AEROBIC AND ANAEROBIC Blood Culture adequate volume   Culture  Setup Time (A)  Final    GRAM VARIABLE ROD ANAEROBIC BOTTLE ONLY CRITICAL RESULT CALLED TO, READ BACK BY AND VERIFIED WITH: S COBLE RN [id number] 05/22/17 A BROWNING    Culture (A)  Final    ANAEROBIC GRAM POSITIVE RODS UNABLE TO FURTHER IDENTIFY    Report Status 05/25/2017 FINAL  Final  Blood Culture ID Panel (Reflexed)     Status: None    Collection Time: 05/20/17  2:05 PM  Result Value Ref Range Status   Enterococcus species NOT DETECTED NOT DETECTED Final   Vancomycin resistance NOT DETECTED NOT DETECTED Final   Listeria monocytogenes NOT DETECTED NOT DETECTED Final   Staphylococcus species NOT DETECTED NOT DETECTED Final   Staphylococcus aureus NOT DETECTED NOT DETECTED Final   Methicillin resistance NOT DETECTED NOT DETECTED Final   Streptococcus species NOT DETECTED NOT DETECTED Final   Streptococcus agalactiae NOT DETECTED NOT DETECTED Final   Streptococcus pneumoniae NOT DETECTED NOT DETECTED Final   Streptococcus pyogenes NOT DETECTED NOT DETECTED Final   Acinetobacter baumannii NOT DETECTED NOT DETECTED Final   Enterobacteriaceae species NOT DETECTED NOT DETECTED Final   Enterobacter cloacae complex NOT DETECTED NOT DETECTED Final   Escherichia coli NOT DETECTED NOT DETECTED Final   Klebsiella oxytoca NOT DETECTED NOT DETECTED Final   Klebsiella pneumoniae NOT DETECTED NOT DETECTED Final   Proteus species NOT DETECTED NOT DETECTED Final   Serratia marcescens NOT DETECTED NOT DETECTED Final   Carbapenem resistance NOT DETECTED NOT DETECTED Final   Haemophilus influenzae NOT DETECTED NOT DETECTED Final   Neisseria meningitidis NOT DETECTED NOT DETECTED Final   Pseudomonas aeruginosa NOT DETECTED NOT DETECTED Final   Candida albicans NOT DETECTED NOT DETECTED Final   Candida glabrata NOT DETECTED NOT DETECTED Final   Candida krusei NOT DETECTED NOT DETECTED Final   Candida parapsilosis NOT DETECTED NOT DETECTED Final   Candida tropicalis NOT DETECTED NOT DETECTED Final  Culture, blood (Routine X 2) w Reflex to ID Panel     Status: None (Preliminary result)   Collection Time: 05/20/17  2:10 PM  Result Value Ref Range Status   Specimen Description BLOOD RIGHT ANTECUBITAL  Final   Special Requests   Final    BOTTLES DRAWN AEROBIC AND ANAEROBIC Blood Culture adequate volume   Culture  Setup Time   Final    GRAM  NEGATIVE RODS ANAEROBIC BOTTLE ONLY CRITICAL VALUE NOTED.  VALUE IS CONSISTENT WITH PREVIOUSLY REPORTED AND CALLED VALUE.    Culture   Final    GRAM NEGATIVE RODS HOLDING FOR POSSIBLE ANAEROBE    Report Status PENDING  Incomplete  MRSA PCR Screening     Status: None   Collection Time: 05/20/17  6:27 PM  Result Value Ref Range Status   MRSA by PCR NEGATIVE NEGATIVE Final    Comment:        The GeneXpert MRSA Assay (FDA approved for NASAL specimens only),  is one component of a comprehensive MRSA colonization surveillance program. It is not intended to diagnose MRSA infection nor to guide or monitor treatment for MRSA infections.   Culture, blood (Routine x 2)     Status: Abnormal   Collection Time: 05/23/17  6:59 PM  Result Value Ref Range Status   Specimen Description BLOOD RIGHT ANTECUBITAL  Final   Special Requests   Final    BOTTLES DRAWN AEROBIC AND ANAEROBIC Blood Culture adequate volume   Culture  Setup Time   Final    GRAM POSITIVE COCCI IN CHAINS IN BOTH AEROBIC AND ANAEROBIC BOTTLES CRITICAL VALUE NOTED.  VALUE IS CONSISTENT WITH PREVIOUSLY REPORTED AND CALLED VALUE.    Culture (A)  Final    ENTEROCOCCUS FAECALIS SUSCEPTIBILITIES PERFORMED ON PREVIOUS CULTURE WITHIN THE LAST 5 DAYS.    Report Status 05/26/2017 FINAL  Final  Culture, blood (Routine x 2)     Status: Abnormal   Collection Time: 05/23/17  7:05 PM  Result Value Ref Range Status   Specimen Description BLOOD LEFT ANTECUBITAL  Final   Special Requests   Final    BOTTLES DRAWN AEROBIC AND ANAEROBIC Blood Culture adequate volume   Culture  Setup Time   Final    GRAM POSITIVE COCCI IN CHAINS IN BOTH AEROBIC AND ANAEROBIC BOTTLES CRITICAL RESULT CALLED TO, READ BACK BY AND VERIFIED WITH: Corliss Parish [phone] MLM    Culture ENTEROCOCCUS FAECALIS (A)  Final   Report Status 05/26/2017 FINAL  Final   Organism ID, Bacteria ENTEROCOCCUS FAECALIS  Final      Susceptibility   Enterococcus  faecalis - MIC*    AMPICILLIN <=2 SENSITIVE Sensitive     VANCOMYCIN 1 SENSITIVE Sensitive     GENTAMICIN SYNERGY SENSITIVE Sensitive     * ENTEROCOCCUS FAECALIS  Blood Culture ID Panel (Reflexed)     Status: Abnormal   Collection Time: 05/23/17  7:05 PM  Result Value Ref Range Status   Enterococcus species DETECTED (A) NOT DETECTED Final    Comment: CRITICAL RESULT CALLED TO, READ BACK BY AND VERIFIED WITH: PHARMD N BATCHELDER [phone] MLM    Vancomycin resistance NOT DETECTED NOT DETECTED Final   Listeria monocytogenes NOT DETECTED NOT DETECTED Final   Staphylococcus species NOT DETECTED NOT DETECTED Final   Staphylococcus aureus NOT DETECTED NOT DETECTED Final   Methicillin resistance NOT DETECTED NOT DETECTED Final   Streptococcus species NOT DETECTED NOT DETECTED Final   Streptococcus agalactiae NOT DETECTED NOT DETECTED Final   Streptococcus pneumoniae NOT DETECTED NOT DETECTED Final   Streptococcus pyogenes NOT DETECTED NOT DETECTED Final   Acinetobacter baumannii NOT DETECTED NOT DETECTED Final   Enterobacteriaceae species NOT DETECTED NOT DETECTED Final   Enterobacter cloacae complex NOT DETECTED NOT DETECTED Final   Escherichia coli NOT DETECTED NOT DETECTED Final   Klebsiella oxytoca NOT DETECTED NOT DETECTED Final   Klebsiella pneumoniae NOT DETECTED NOT DETECTED Final   Proteus species NOT DETECTED NOT DETECTED Final   Serratia marcescens NOT DETECTED NOT DETECTED Final   Carbapenem resistance NOT DETECTED NOT DETECTED Final   Haemophilus influenzae NOT DETECTED NOT DETECTED Final   Neisseria meningitidis NOT DETECTED NOT DETECTED Final   Pseudomonas aeruginosa NOT DETECTED NOT DETECTED Final   Candida albicans NOT DETECTED NOT DETECTED Final   Candida glabrata NOT DETECTED NOT DETECTED Final   Candida krusei NOT DETECTED NOT DETECTED Final   Candida parapsilosis NOT DETECTED NOT DETECTED Final   Candida tropicalis NOT DETECTED NOT  DETECTED Final  Culture, blood  (Routine X 2) w Reflex to ID Panel     Status: None (Preliminary result)   Collection Time: 05/24/17  3:20 PM  Result Value Ref Range Status   Specimen Description BLOOD RIGHT HAND  Final   Special Requests   Final    BOTTLES DRAWN AEROBIC AND ANAEROBIC Blood Culture adequate volume   Culture NO GROWTH 2 DAYS  Final   Report Status PENDING  Incomplete  Culture, blood (Routine X 2) w Reflex to ID Panel     Status: None (Preliminary result)   Collection Time: 05/24/17  3:30 PM  Result Value Ref Range Status   Specimen Description BLOOD LEFT HAND  Final   Special Requests   Final    BOTTLES DRAWN AEROBIC AND ANAEROBIC Blood Culture adequate volume   Culture NO GROWTH 2 DAYS  Final   Report Status PENDING  Incomplete    Studies/Results: No results found.   Assessment/Plan: Enterococcal bacteremia G+R and Gram Variable anaerobe on BCx 9-3.  PEN allergy Discitis  Total days of antibiotics: 3 vanco/flagyl  Await further info on his Cx Will stay on anbx for 6 weeks Does not want to be on addicting narcotics.  Would not place PIC given questionable hx of IVDA Had reaction that sounds like anaphylaxis to PEN in the 80s. I states he has taken amoxil prior, could consider trial of augmentin po, observed prior to d/c.   Addendum Spoke with RN and pt- will trial Augmentin to see if we can use at d/c. Nurse will observe. Benadryl prior.          Johny Sax Infectious Diseases (pager) [phone] [email or web address] 05/27/2017, 3:41 PM  LOS: 3 days

## 2017-05-27 NOTE — Progress Notes (Signed)
Pharmacy Antibiotic Note  Dan [name] is a 46 y.o. male admitted on 05/23/2017 with diskitis and Enterococcal bacteremia.  Pharmacy has been consulted for Vancocin and cefepime dosing.    Vancomycin trough is subtherapeutic at 11 mcg/mL.  Patient is afebrile with WNL WBC.   Plan: Increase vanc to 1750mg  IV Q8H Continue to follow  Temp (24hrs), Avg:98.4 F (36.9 C), Min:98.2 F (36.8 C), Max:98.5 F (36.9 C)   Recent Labs Lab 05/22/17 0250 05/23/17 1859 05/23/17 1909 05/23/17 2300 05/24/17 0512 05/26/17 0346 05/26/17 1121 05/27/17 0230 05/27/17 1419  WBC 9.7 11.3*  --   --  10.1 7.6  --   --   --   CREATININE 0.78 0.73  --   --  0.59* 0.61  --  0.53*  --   LATICACIDVEN  --   --  1.32 1.34  --   --   --   --   --   VANCOTROUGH  --   --   --   --   --   --  8*  --  11*    Estimated Creatinine Clearance: 124.2 mL/min (A) (by C-G formula based on SCr of 0.53 mg/dL (L)).    Allergies  Allergen Reactions  . Bee Venom Anaphylaxis, Shortness Of Breath and Swelling  . Penicillins Hives    Has patient had a PCN reaction causing immediate rash, facial/tongue/throat swelling, SOB or lightheadedness with hypotension: Yes Has patient had a PCN reaction causing severe rash involving mucus membranes or skin necrosis: Unknown Has patient had a PCN reaction that required hospitalization: No (was already IN the hosp) Has patient had a PCN reaction occurring within the last 10 years: No If all of the above answers are "NO", then may proceed with Cephalosporin use.     Vanc 9/7 >>  9/9 VT = 8 mcg/mL on 1g q8 >> 1500mg  q8 9/10 VT = 11 mcg / mL >> 1750 mg Q 8   9/3 BCx - GVR / GNR (BCID negative) >> final, unable to identify further 9/6 BCx - E.faecalis, pan sensitive (BCID Enterococcus)  9/7 BCx - NGTD  Thank you Okey RegalLisa Ellner, PharmD [id number] 05/27/2017, 3:21 PM

## 2017-05-27 NOTE — Progress Notes (Signed)
PROGRESS NOTE    MACARI ZALESKY  ZOX:[id number] DOB: [birth year] DOA: 05/23/2017 PCP: Patient, No Pcp Per   Chief Complaint  Patient presents with  . Back Pain    Brief Narrative:  HPI On 05/24/2017 by Dr. Rikki Spearing Powellis a 46 y.o.malewith medical history significant of diabetes mellitus, IV drug use, methamphetamine abuse, tobacco abuse, medication noncompliance, who presents with lower back pain and a urinary incontinence.  Patient states that he has been having lower back pain for about 10 days, which has been progressively getting worse. His back pain is constant, 10 out of 10 in severity, sharp, radiating to left leg posteriorly. He has intermittent urinary incontinence. He denies weakness in the legs. No fever or chills. Patient does not have chest pain, shortness breath, cough, nausea, vomiting, diarrhea, abdominal pain, symptoms of UTI. No headache, photophobia, neck rigidity. [facility] refused to have an MRI performed. He ended up having positive blood cultures for Grewgram-negative rods, and was called back today.   Assessment & Plan   Discitis of the lumbosacral region/bacteremia/possible osteomyelitis/bacteremia -MRI concerning for discitis of the L5-S1 region -Currently hemodynamically stable -Blood cultures 1/2 from 05/20/2017 showed gram variable rod -Blood culture 05/23/2017 detected enterococcus faecalis, Susceptibilities pending -Blood cultures from 05/24/2017 show no growth to date -Neurosurgery consultation appreciated, recommended IR for disc aspiration -Discussed with interventional radiology, does not feel patient needs aspiration, continue antibiotic treatment -Echocardiogram EF 55-60%, no RWMA -Infectious disease consulted and appreciated -Continue pain control -was placed on vancomycin/cefepime- transitioned to vanc/flagyl as patient has PCN allergy  -pending further rec from ID -PT consulted  Diabetes  mellitus, 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 -Hemoglobin A1c 11.5 05/20/2017 -Patient does not take any medications at home -Presented with blood sugar of 433 -Continue insulin sliding scale, Lantus, CBG monitoring -Increase Lantus to 12 units daily  Polysubstance abuse  -Toxicology screen positive for amphetamines (patient has no home medications) -Discussed cessation from all illicit drugs as well as tobacco use -Continue nicotine patch  Normocytic anemia -Hemoglobin currently 11.2, baseline approximately 12-14 -Continue to monitor CBC  Hypokalemia -Possibly secondary to insulin -Resolved, continue to monitor BMP  DVT Prophylaxis  SCDs  Code Status: Full  Family Communication: None at bedside  Disposition Plan:  Admitted.   Consultants Neurourgery Interventional radiology Infectious disease  Procedures  Echocardiogram   Antibiotics   Anti-infectives    Start     Dose/Rate Route Frequency Ordered Stop   05/26/17 1400  vancomycin (VANCOCIN) 1,500 mg in sodium chloride 0.9 % 500 mL IVPB  Status:  Discontinued     1,500 mg 250 mL/hr over 120 Minutes Intravenous Every 8 hours 05/26/17 1308 05/26/17 1308   05/26/17 1400  Ampicillin-Sulbactam (UNASYN) 3 g in sodium chloride 0.9 % 100 mL IVPB  Status:  Discontinued     3 g 200 mL/hr over 30 Minutes Intravenous Every 6 hours 05/26/17 1315 05/26/17 1324   05/26/17 1400  vancomycin (VANCOCIN) 1,500 mg in sodium chloride 0.9 % 500 mL IVPB     1,500 mg 250 mL/hr over 120 Minutes Intravenous Every 8 hours 05/26/17 1324     05/26/17 1400  metroNIDAZOLE (FLAGYL) tablet 500 mg     500 mg Oral Every 8 hours 05/26/17 1324     05/24/17 1400  ceFEPIme (MAXIPIME) 1 g in dextrose 5 % 50 mL IVPB  Status:  Discontinued     1 g 100 mL/hr over 30 Minutes Intravenous Every 8  hours 05/24/17 0503 05/26/17 1308   05/24/17 1200  vancomycin (VANCOCIN) IVPB 1000 mg/200 mL premix  Status:  Discontinued     1,000 mg 200 mL/hr over 60  Minutes Intravenous Every 8 hours 05/24/17 0503 05/26/17 1308   05/24/17 0400  vancomycin (VANCOCIN) 1,500 mg in sodium chloride 0.9 % 500 mL IVPB     1,500 mg 250 mL/hr over 120 Minutes Intravenous  Once 05/24/17 0353 05/24/17 0706   05/24/17 0400  ceFEPIme (MAXIPIME) 2 g in dextrose 5 % 50 mL IVPB     2 g 100 mL/hr over 30 Minutes Intravenous  Once 05/24/17 0353 05/24/17 0455      Subjective:   Theodoro Grist seen and examined today.  Continues complain of back pain. Does not feel that IV pain medications or narcotics are helping him, however does not want to become hooked on them. Denies chest pain, shortness breath, abdominal pain, nausea or vomiting, diarrhea or constipation.  Objective:   Vitals:   05/26/17 0503 05/26/17 1522 05/26/17 1900 05/27/17 0517  BP: 125/70 134/73 117/60 (!) 142/79  Pulse: 79 84 75 75  Resp: Temp: 98.1 F (36.7 C) 98.5 F (36.9 C) 98.2 F (36.8 C) 98.5 F (36.9 C)  TempSrc: Oral Oral Oral Oral  SpO2: 98% 98% 97% 98%  Weight:      Height:        Intake/Output Summary (Last 24 hours) at 05/27/17 1409 Last data filed at 05/27/17 0424  Gross per 24 hour  Intake             1360 ml  Output                0 ml  Net             1360 ml   Filed Weights   05/24/17 0645  Weight: 86.2 kg (190 lb 0.6 oz)   Exam  General: Well developed, well nourished, no distress  HEENT: NCAT, mucous membranes moist.   Cardiovascular: S1 S2 auscultated,RRR, no murmurs  Respiratory: Clear to auscultation bilaterally with equal chest rise  Abdomen: Soft, nontender, nondistended, + bowel sounds  Extremities: warm dry without cyanosis clubbing or edema  Neuro: AAOx3, nonfocal  Psych: Appropriate  Data Reviewed: I have personally reviewed following labs and imaging studies  CBC:  Recent Labs Lab 05/22/17 0250 05/23/17 1859 05/24/17 0512 05/26/17 0346  WBC 9.7 11.3* 10.1 7.6  NEUTROABS 7.3 9.6*  --   --   HGB 12.4* 12.6* 11.9* 11.2*    HCT 37.2* 37.7* 36.1* 34.5*  MCV 87.7 86.5 87.4 86.9  PLT 123* 141* 143* 165   Basic Metabolic Panel:  Recent Labs Lab 05/22/17 0250 05/23/17 1859 05/24/17 0512 05/26/17 0346 05/27/17 0230  NA 136 136 137 136 137  K 3.5 3.9 3.7 3.4* 3.6  CL 104 103 106 99* 99*  CO2 GLUCOSE 401* 477* 289* 283* 188*  BUN CREATININE 0.78 0.73 0.59* 0.61 0.53*  CALCIUM 8.4* 8.6* 8.5* 8.6* 8.6*   GFR: Estimated Creatinine Clearance: 124.2 mL/min (A) (by C-G formula based on SCr of 0.53 mg/dL (L)). Liver Function Tests:  Recent Labs Lab 05/23/17 1859  AST 20  ALT 15*  ALKPHOS 65  BILITOT 0.8  PROT 6.4*  ALBUMIN 3.1*   No results for input(s): LIPASE, AMYLASE in the last 168 hours.  Recent Labs Lab 05/20/17 1410  AMMONIA 18  Coagulation Profile:  Recent Labs Lab 05/23/17 1859  INR 0.92   Cardiac Enzymes:  Recent Labs Lab 05/20/17 1410 05/20/17 1641 05/20/17 2018  TROPONINI <0.03 <0.03 <0.03   BNP (last 3 results) No results for input(s): PROBNP in the last 8760 hours. HbA1C: No results for input(s): HGBA1C in the last 72 hours. CBG:  Recent Labs Lab 05/26/17 1139 05/26/17 1645 05/26/17 2050 05/27/17 0621 05/27/17 1136  GLUCAP 189* 257* 160* 231* 201*   Lipid Profile: No results for input(s): CHOL, HDL, LDLCALC, TRIG, CHOLHDL, LDLDIRECT in the last 72 hours. Thyroid Function Tests: No results for input(s): TSH, T4TOTAL, FREET4, T3FREE, THYROIDAB in the last 72 hours. Anemia Panel: No results for input(s): VITAMINB12, FOLATE, FERRITIN, TIBC, IRON, RETICCTPCT in the last 72 hours. Urine analysis:    Component Value Date/Time   COLORURINE YELLOW 05/23/2017 2306   APPEARANCEUR CLEAR 05/23/2017 2306   LABSPEC 1.035 (H) 05/23/2017 2306   PHURINE 5.0 05/23/2017 2306   GLUCOSEU >=500 (A) 05/23/2017 2306   HGBUR NEGATIVE 05/23/2017 2306   BILIRUBINUR NEGATIVE 05/23/2017 2306   KETONESUR 5 (A) 05/23/2017 2306   PROTEINUR NEGATIVE  05/23/2017 2306   UROBILINOGEN 2.0 (H) 07/30/2015 2258   NITRITE NEGATIVE 05/23/2017 2306   LEUKOCYTESUR NEGATIVE 05/23/2017 2306   Sepsis Labs: (procalcitonin:4,lacticidven:4)  ) Recent Results (from the past 240 hour(s))  Culture, blood (Routine X 2) w Reflex to ID Panel     Status: Abnormal   Collection Time: 05/20/17  2:05 PM  Result Value Ref Range Status   Specimen Description BLOOD RIGHT HAND  Final   Special Requests   Final    BOTTLES DRAWN AEROBIC AND ANAEROBIC Blood Culture adequate volume   Culture  Setup Time (A)  Final    GRAM VARIABLE ROD ANAEROBIC BOTTLE ONLY CRITICAL RESULT CALLED TO, READ BACK BY AND VERIFIED WITH: S COBLE RN 2303 05/22/17 A BROWNING    Culture (A)  Final    ANAEROBIC GRAM POSITIVE RODS UNABLE TO FURTHER IDENTIFY    Report Status 05/25/2017 FINAL  Final  Blood Culture ID Panel (Reflexed)     Status: None   Collection Time: 05/20/17  2:05 PM  Result Value Ref Range Status   Enterococcus species NOT DETECTED NOT DETECTED Final   Vancomycin resistance NOT DETECTED NOT DETECTED Final   Listeria monocytogenes NOT DETECTED NOT DETECTED Final   Staphylococcus species NOT DETECTED NOT DETECTED Final   Staphylococcus aureus NOT DETECTED NOT DETECTED Final   Methicillin resistance NOT DETECTED NOT DETECTED Final   Streptococcus species NOT DETECTED NOT DETECTED Final   Streptococcus agalactiae NOT DETECTED NOT DETECTED Final   Streptococcus pneumoniae NOT DETECTED NOT DETECTED Final   Streptococcus pyogenes NOT DETECTED NOT DETECTED Final   Acinetobacter baumannii NOT DETECTED NOT DETECTED Final   Enterobacteriaceae species NOT DETECTED NOT DETECTED Final   Enterobacter cloacae complex NOT DETECTED NOT DETECTED Final   Escherichia coli NOT DETECTED NOT DETECTED Final   Klebsiella oxytoca NOT DETECTED NOT DETECTED Final   Klebsiella pneumoniae NOT DETECTED NOT DETECTED Final   Proteus species NOT DETECTED NOT DETECTED Final   Serratia  marcescens NOT DETECTED NOT DETECTED Final   Carbapenem resistance NOT DETECTED NOT DETECTED Final   Haemophilus influenzae NOT DETECTED NOT DETECTED Final   Neisseria meningitidis NOT DETECTED NOT DETECTED Final   Pseudomonas aeruginosa NOT DETECTED NOT DETECTED Final   Candida albicans NOT DETECTED NOT DETECTED Final   Candida glabrata NOT DETECTED NOT DETECTED Final   Candida krusei  NOT DETECTED NOT DETECTED Final   Candida parapsilosis NOT DETECTED NOT DETECTED Final   Candida tropicalis NOT DETECTED NOT DETECTED Final  Culture, blood (Routine X 2) w Reflex to ID Panel     Status: None (Preliminary result)   Collection Time: 05/20/17  2:10 PM  Result Value Ref Range Status   Specimen Description BLOOD RIGHT ANTECUBITAL  Final   Special Requests   Final    BOTTLES DRAWN AEROBIC AND ANAEROBIC Blood Culture adequate volume   Culture  Setup Time   Final    GRAM NEGATIVE RODS ANAEROBIC BOTTLE ONLY CRITICAL VALUE NOTED.  VALUE IS CONSISTENT WITH PREVIOUSLY REPORTED AND CALLED VALUE.    Culture   Final    GRAM NEGATIVE RODS HOLDING FOR POSSIBLE ANAEROBE    Report Status PENDING  Incomplete  MRSA PCR Screening     Status: None   Collection Time: 05/20/17  6:27 PM  Result Value Ref Range Status   MRSA by PCR NEGATIVE NEGATIVE Final    Comment:        The GeneXpert MRSA Assay (FDA approved for NASAL specimens only), is one component of a comprehensive MRSA colonization surveillance program. It is not intended to diagnose MRSA infection nor to guide or monitor treatment for MRSA infections.   Culture, blood (Routine x 2)     Status: Abnormal   Collection Time: 05/23/17  6:59 PM  Result Value Ref Range Status   Specimen Description BLOOD RIGHT ANTECUBITAL  Final   Special Requests   Final    BOTTLES DRAWN AEROBIC AND ANAEROBIC Blood Culture adequate volume   Culture  Setup Time   Final    GRAM POSITIVE COCCI IN CHAINS IN BOTH AEROBIC AND ANAEROBIC BOTTLES CRITICAL VALUE  NOTED.  VALUE IS CONSISTENT WITH PREVIOUSLY REPORTED AND CALLED VALUE.    Culture (A)  Final    ENTEROCOCCUS FAECALIS SUSCEPTIBILITIES PERFORMED ON PREVIOUS CULTURE WITHIN THE LAST 5 DAYS.    Report Status 05/26/2017 FINAL  Final  Culture, blood (Routine x 2)     Status: Abnormal   Collection Time: 05/23/17  7:05 PM  Result Value Ref Range Status   Specimen Description BLOOD LEFT ANTECUBITAL  Final   Special Requests   Final    BOTTLES DRAWN AEROBIC AND ANAEROBIC Blood Culture adequate volume   Culture  Setup Time   Final    GRAM POSITIVE COCCI IN CHAINS IN BOTH AEROBIC AND ANAEROBIC BOTTLES CRITICAL RESULT CALLED TO, READ BACK BY AND VERIFIED WITH: Corliss Parish [id number] MLM    Culture ENTEROCOCCUS FAECALIS (A)  Final   Report Status 05/26/2017 FINAL  Final   Organism ID, Bacteria ENTEROCOCCUS FAECALIS  Final      Susceptibility   Enterococcus faecalis - MIC*    AMPICILLIN <=2 SENSITIVE Sensitive     VANCOMYCIN 1 SENSITIVE Sensitive     GENTAMICIN SYNERGY SENSITIVE Sensitive     * ENTEROCOCCUS FAECALIS  Blood Culture ID Panel (Reflexed)     Status: Abnormal   Collection Time: 05/23/17  7:05 PM  Result Value Ref Range Status   Enterococcus species DETECTED (A) NOT DETECTED Final    Comment: CRITICAL RESULT CALLED TO, READ BACK BY AND VERIFIED WITH: PHARMD N BATCHELDER [id number] MLM    Vancomycin resistance NOT DETECTED NOT DETECTED Final   Listeria monocytogenes NOT DETECTED NOT DETECTED Final   Staphylococcus species NOT DETECTED NOT DETECTED Final   Staphylococcus aureus NOT DETECTED NOT DETECTED Final   Methicillin resistance  NOT DETECTED NOT DETECTED Final   Streptococcus species NOT DETECTED NOT DETECTED Final   Streptococcus agalactiae NOT DETECTED NOT DETECTED Final   Streptococcus pneumoniae NOT DETECTED NOT DETECTED Final   Streptococcus pyogenes NOT DETECTED NOT DETECTED Final   Acinetobacter baumannii NOT DETECTED NOT DETECTED Final    Enterobacteriaceae species NOT DETECTED NOT DETECTED Final   Enterobacter cloacae complex NOT DETECTED NOT DETECTED Final   Escherichia coli NOT DETECTED NOT DETECTED Final   Klebsiella oxytoca NOT DETECTED NOT DETECTED Final   Klebsiella pneumoniae NOT DETECTED NOT DETECTED Final   Proteus species NOT DETECTED NOT DETECTED Final   Serratia marcescens NOT DETECTED NOT DETECTED Final   Carbapenem resistance NOT DETECTED NOT DETECTED Final   Haemophilus influenzae NOT DETECTED NOT DETECTED Final   Neisseria meningitidis NOT DETECTED NOT DETECTED Final   Pseudomonas aeruginosa NOT DETECTED NOT DETECTED Final   Candida albicans NOT DETECTED NOT DETECTED Final   Candida glabrata NOT DETECTED NOT DETECTED Final   Candida krusei NOT DETECTED NOT DETECTED Final   Candida parapsilosis NOT DETECTED NOT DETECTED Final   Candida tropicalis NOT DETECTED NOT DETECTED Final  Culture, blood (Routine X 2) w Reflex to ID Panel     Status: None (Preliminary result)   Collection Time: 05/24/17  3:20 PM  Result Value Ref Range Status   Specimen Description BLOOD RIGHT HAND  Final   Special Requests   Final    BOTTLES DRAWN AEROBIC AND ANAEROBIC Blood Culture adequate volume   Culture NO GROWTH 2 DAYS  Final   Report Status PENDING  Incomplete  Culture, blood (Routine X 2) w Reflex to ID Panel     Status: None (Preliminary result)   Collection Time: 05/24/17  3:30 PM  Result Value Ref Range Status   Specimen Description BLOOD LEFT HAND  Final   Special Requests   Final    BOTTLES DRAWN AEROBIC AND ANAEROBIC Blood Culture adequate volume   Culture NO GROWTH 2 DAYS  Final   Report Status PENDING  Incomplete      Radiology Studies: No results found.   Scheduled Meds: . insulin aspart  0-9 Units Subcutaneous TID WC  . insulin glargine  10 Units Subcutaneous Daily  . metroNIDAZOLE  500 mg Oral Q8H  . nicotine  21 mg Transdermal Daily   Continuous Infusions: . vancomycin Stopped (05/27/17 0716)      LOS: 3 days   Time Spent in minutes   30 minutes  Davien Malone D.O. on 05/27/2017 at 2:09 PM  Between 7am to 7pm - Pager - [phone]  After 7pm go to [email or web address] - password [email or web address]  And look for the night coverage person covering for me after hours  [facility] Office  [phone]

## 2017-05-28 ENCOUNTER — Encounter (HOSPITAL_COMMUNITY): Payer: Self-pay

## 2017-05-28 LAB — BASIC METABOLIC PANEL
Anion gap: 7 (ref 5–15)
BUN: 7 mg/dL (ref 6–20)
CO2: 30 mmol/L (ref 22–32)
CREATININE: 0.54 mg/dL — AB (ref 0.61–1.24)
Calcium: 8.5 mg/dL — ABNORMAL LOW (ref 8.9–10.3)
Chloride: 98 mmol/L — ABNORMAL LOW (ref 101–111)
GFR calc Af Amer: >60 mL/min (ref >60)
GLUCOSE: 236 mg/dL — AB (ref 65–99)
POTASSIUM: 3.9 mmol/L (ref 3.5–5.1)
Sodium: 135 mmol/L (ref 135–145)

## 2017-05-28 LAB — GLUCOSE, CAPILLARY
Glucose-Capillary: 216 mg/dL — ABNORMAL HIGH (ref 65–99)
Glucose-Capillary: 297 mg/dL — ABNORMAL HIGH (ref 65–99)
Glucose-Capillary: 230 mg/dL — ABNORMAL HIGH (ref 65–99)
Glucose-Capillary: 173 mg/dL — ABNORMAL HIGH (ref 65–99)

## 2017-05-28 LAB — CULTURE, BLOOD (ROUTINE X 2): SPECIAL REQUESTS: ADEQUATE

## 2017-05-28 LAB — CBC
HCT: 34 % — ABNORMAL LOW (ref 39.0–52.0)
Hemoglobin: 11.1 g/dL — ABNORMAL LOW (ref 13.0–17.0)
MCH: 28.5 pg (ref 26.0–34.0)
MCHC: 32.6 g/dL (ref 30.0–36.0)
MCV: 87.2 fL (ref 78.0–100.0)
PLATELETS: 212 10*3/uL (ref 150–400)
RBC: 3.9 MIL/uL — ABNORMAL LOW (ref 4.22–5.81)
RDW: 12.8 % (ref 11.5–15.5)
WBC: 8.9 10*3/uL (ref 4.0–10.5)

## 2017-05-28 MED ORDER — AMOXICILLIN-POT CLAVULANATE 875-125 MG PO TABS
1.0000 | ORAL_TABLET | Freq: Two times a day (BID) | ORAL | Status: DC
  Administered 2017-05-28 – 2017-05-31 (×7): 1 via ORAL
  Filled 2017-05-28 (×7): qty 1

## 2017-05-28 MED ORDER — SENNOSIDES-DOCUSATE SODIUM 8.6-50 MG PO TABS
1.0000 | ORAL_TABLET | Freq: Two times a day (BID) | ORAL | Status: DC
  Administered 2017-05-28 – 2017-05-31 (×6): 1 via ORAL
  Filled 2017-05-28 (×6): qty 1

## 2017-05-28 MED ORDER — MORPHINE SULFATE (PF) 4 MG/ML IV SOLN
1.0000 mg | INTRAVENOUS | Status: DC | PRN
  Administered 2017-05-28 – 2017-05-29 (×4): 1 mg via INTRAVENOUS
  Filled 2017-05-28 (×4): qty 1

## 2017-05-28 MED ORDER — INSULIN GLARGINE 100 UNIT/ML ~~LOC~~ SOLN
15.0000 [IU] | Freq: Every day | SUBCUTANEOUS | Status: DC
  Administered 2017-05-29 – 2017-05-31 (×3): 15 [IU] via SUBCUTANEOUS
  Filled 2017-05-28 (×3): qty 0.15

## 2017-05-28 MED ORDER — POLYETHYLENE GLYCOL 3350 17 G PO PACK
17.0000 g | PACK | Freq: Every day | ORAL | Status: DC
  Administered 2017-05-28 – 2017-05-30 (×3): 17 g via ORAL
  Filled 2017-05-28 (×3): qty 1

## 2017-05-28 NOTE — Care Management Note (Addendum)
Case Management Note  Patient Details  Name: Dan GaleaJohn W [name] MRN: [id number] Date of Birth: 1971/01/28  Subjective/Objective:                 Patient from home, admitted for septic lumbosacral region. Currently on IV Vanc. Per ID notes will attempt to transition to PO abx prior to DC (potentially augmentin and flagyl) due to history of IVDA. Patient uninsured without PCP. Appointment made to [facility]) entered on AVS. Patient will need MATCH at DC if DCing on insulin. [facility] They state they have Lantus and novolog in stock in both pens and vials and would be able to fill either with a MATCH letter.    Action/Plan:  Watch for MATCH needs at DC.  Expected Discharge Date:                  Expected Discharge Plan:  Home/Self Care  In-House Referral:     Discharge planning Services  CM Consult, Follow-up appt scheduled, Medication Assistance  Post Acute Care Choice:    Choice offered to:     DME Arranged:    DME Agency:     HH Arranged:    HH Agency:     Status of Service:  In process, will continue to follow  If discussed at Long Length of Stay Meetings, dates discussed:    Additional Comments:  Lawerance SabalDebbie Harmonee Tozer, RN 05/28/2017, 2:45 PM

## 2017-05-28 NOTE — Progress Notes (Addendum)
INFECTIOUS DISEASE PROGRESS NOTE  ID: Dan [name] is a 46 y.o. male with  Principal Problem:   Discitis of lumbosacral region Active Problems:   Diabetes mellitus without complication (HCC)   IVDU (intravenous drug user)   Back pain   Methamphetamine abuse   Tobacco abuse   Bacteremia due to Gram-negative bacteria  Subjective: Can't remember getting amoxil yesterday.  C/o constipation Was not able to participate with PT today due to pain.   Abtx:  Anti-infectives    Start     Dose/Rate Route Frequency Ordered Stop   05/27/17 1730  amoxicillin-clavulanate (AUGMENTIN) 875-125 MG per tablet 1 tablet     1 tablet Oral  Once 05/27/17 1623 05/27/17 1757   05/27/17 1600  vancomycin (VANCOCIN) 1,750 mg in sodium chloride 0.9 % 500 mL IVPB     1,750 mg 250 mL/hr over 120 Minutes Intravenous Every 8 hours 05/27/17 1521     05/26/17 1400  vancomycin (VANCOCIN) 1,500 mg in sodium chloride 0.9 % 500 mL IVPB  Status:  Discontinued     1,500 mg 250 mL/hr over 120 Minutes Intravenous Every 8 hours 05/26/17 1308 05/26/17 1308   05/26/17 1400  Ampicillin-Sulbactam (UNASYN) 3 g in sodium chloride 0.9 % 100 mL IVPB  Status:  Discontinued     3 g 200 mL/hr over 30 Minutes Intravenous Every 6 hours 05/26/17 1315 05/26/17 1324   05/26/17 1400  vancomycin (VANCOCIN) 1,500 mg in sodium chloride 0.9 % 500 mL IVPB  Status:  Discontinued     1,500 mg 250 mL/hr over 120 Minutes Intravenous Every 8 hours 05/26/17 1324 05/27/17 1521   05/26/17 1400  metroNIDAZOLE (FLAGYL) tablet 500 mg     500 mg Oral Every 8 hours 05/26/17 1324     05/24/17 1400  ceFEPIme (MAXIPIME) 1 g in dextrose 5 % 50 mL IVPB  Status:  Discontinued     1 g 100 mL/hr over 30 Minutes Intravenous Every 8 hours 05/24/17 0503 05/26/17 1308   05/24/17 1200  vancomycin (VANCOCIN) IVPB 1000 mg/200 mL premix  Status:  Discontinued     1,000 mg 200 mL/hr over 60 Minutes Intravenous Every 8 hours 05/24/17 0503 05/26/17 1308   05/24/17  0400  vancomycin (VANCOCIN) 1,500 mg in sodium chloride 0.9 % 500 mL IVPB     1,500 mg 250 mL/hr over 120 Minutes Intravenous  Once 05/24/17 0353 05/24/17 0706   05/24/17 0400  ceFEPIme (MAXIPIME) 2 g in dextrose 5 % 50 mL IVPB     2 g 100 mL/hr over 30 Minutes Intravenous  Once 05/24/17 0353 05/24/17 0455      Medications:  Scheduled: . insulin aspart  0-9 Units Subcutaneous TID WC  . [START ON 05/29/2017] insulin glargine  15 Units Subcutaneous Daily  . metroNIDAZOLE  500 mg Oral Q8H  . nicotine  21 mg Transdermal Daily    Objective: Vital signs in last 24 hours: Temp:  [97.3 F (36.3 C)-98.7 F (37.1 C)] 97.3 F (36.3 C) (09/11 0659) Pulse Rate:  [74] 74 (09/11 0659) Resp:  [16-17] 16 (09/11 0659) BP: (114-137)/(70-92) 137/92 (09/11 0659) SpO2:  [99 %-100 %] 100 % (09/11 0659)   General appearance: alert, cooperative and no distress Resp: clear to auscultation bilaterally Cardio: regular rate and rhythm GI: normal findings: bowel sounds normal and soft, non-tender Extremities: edema none  Lab Results  Recent Labs  05/26/17 0346 05/27/17 0230 05/28/17 0602  WBC 7.6  --  8.9  HGB 11.2*  --  11.1*  HCT 34.5*  --  34.0*  NA 136 137 135  K 3.4* 3.6 3.9  CL 99* 99* 98*  CO2 28 30 30   BUN 8 7 7   CREATININE 0.61 0.53* 0.54*   Liver Panel No results for input(s): PROT, ALBUMIN, AST, ALT, ALKPHOS, BILITOT, BILIDIR, IBILI in the last 72 hours. Sedimentation Rate No results for input(s): ESRSEDRATE in the last 72 hours. C-Reactive Protein No results for input(s): CRP in the last 72 hours.  Microbiology: Recent Results (from the past 240 hour(s))  Culture, blood (Routine X 2) w Reflex to ID Panel     Status: Abnormal   Collection Time: 05/20/17  2:05 PM  Result Value Ref Range Status   Specimen Description BLOOD RIGHT HAND  Final   Special Requests   Final    BOTTLES DRAWN AEROBIC AND ANAEROBIC Blood Culture adequate volume   Culture  Setup Time (A)  Final     GRAM VARIABLE ROD ANAEROBIC BOTTLE ONLY CRITICAL RESULT CALLED TO, READ BACK BY AND VERIFIED WITH: S COBLE RN [id number] 05/22/17 A BROWNING    Culture (A)  Final    ANAEROBIC GRAM POSITIVE RODS UNABLE TO FURTHER IDENTIFY    Report Status 05/25/2017 FINAL  Final  Blood Culture ID Panel (Reflexed)     Status: None   Collection Time: 05/20/17  2:05 PM  Result Value Ref Range Status   Enterococcus species NOT DETECTED NOT DETECTED Final   Vancomycin resistance NOT DETECTED NOT DETECTED Final   Listeria monocytogenes NOT DETECTED NOT DETECTED Final   Staphylococcus species NOT DETECTED NOT DETECTED Final   Staphylococcus aureus NOT DETECTED NOT DETECTED Final   Methicillin resistance NOT DETECTED NOT DETECTED Final   Streptococcus species NOT DETECTED NOT DETECTED Final   Streptococcus agalactiae NOT DETECTED NOT DETECTED Final   Streptococcus pneumoniae NOT DETECTED NOT DETECTED Final   Streptococcus pyogenes NOT DETECTED NOT DETECTED Final   Acinetobacter baumannii NOT DETECTED NOT DETECTED Final   Enterobacteriaceae species NOT DETECTED NOT DETECTED Final   Enterobacter cloacae complex NOT DETECTED NOT DETECTED Final   Escherichia coli NOT DETECTED NOT DETECTED Final   Klebsiella oxytoca NOT DETECTED NOT DETECTED Final   Klebsiella pneumoniae NOT DETECTED NOT DETECTED Final   Proteus species NOT DETECTED NOT DETECTED Final   Serratia marcescens NOT DETECTED NOT DETECTED Final   Carbapenem resistance NOT DETECTED NOT DETECTED Final   Haemophilus influenzae NOT DETECTED NOT DETECTED Final   Neisseria meningitidis NOT DETECTED NOT DETECTED Final   Pseudomonas aeruginosa NOT DETECTED NOT DETECTED Final   Candida albicans NOT DETECTED NOT DETECTED Final   Candida glabrata NOT DETECTED NOT DETECTED Final   Candida krusei NOT DETECTED NOT DETECTED Final   Candida parapsilosis NOT DETECTED NOT DETECTED Final   Candida tropicalis NOT DETECTED NOT DETECTED Final  Culture, blood (Routine X 2) w  Reflex to ID Panel     Status: Abnormal   Collection Time: 05/20/17  2:10 PM  Result Value Ref Range Status   Specimen Description BLOOD RIGHT ANTECUBITAL  Final   Special Requests   Final    BOTTLES DRAWN AEROBIC AND ANAEROBIC Blood Culture adequate volume   Culture  Setup Time   Final    GRAM NEGATIVE RODS ANAEROBIC BOTTLE ONLY CRITICAL VALUE NOTED.  VALUE IS CONSISTENT WITH PREVIOUSLY REPORTED AND CALLED VALUE.    Culture CLOSTRIDIUM CLOSTRIDIOFORME (A)  Final   Report Status 05/28/2017 FINAL  Final  MRSA PCR Screening  Status: None   Collection Time: 05/20/17  6:27 PM  Result Value Ref Range Status   MRSA by PCR NEGATIVE NEGATIVE Final    Comment:        The GeneXpert MRSA Assay (FDA approved for NASAL specimens only), is one component of a comprehensive MRSA colonization surveillance program. It is not intended to diagnose MRSA infection nor to guide or monitor treatment for MRSA infections.   Culture, blood (Routine x 2)     Status: Abnormal   Collection Time: 05/23/17  6:59 PM  Result Value Ref Range Status   Specimen Description BLOOD RIGHT ANTECUBITAL  Final   Special Requests   Final    BOTTLES DRAWN AEROBIC AND ANAEROBIC Blood Culture adequate volume   Culture  Setup Time   Final    GRAM POSITIVE COCCI IN CHAINS IN BOTH AEROBIC AND ANAEROBIC BOTTLES CRITICAL VALUE NOTED.  VALUE IS CONSISTENT WITH PREVIOUSLY REPORTED AND CALLED VALUE.    Culture (A)  Final    ENTEROCOCCUS FAECALIS SUSCEPTIBILITIES PERFORMED ON PREVIOUS CULTURE WITHIN THE LAST 5 DAYS.    Report Status 05/26/2017 FINAL  Final  Culture, blood (Routine x 2)     Status: Abnormal   Collection Time: 05/23/17  7:05 PM  Result Value Ref Range Status   Specimen Description BLOOD LEFT ANTECUBITAL  Final   Special Requests   Final    BOTTLES DRAWN AEROBIC AND ANAEROBIC Blood Culture adequate volume   Culture  Setup Time   Final    GRAM POSITIVE COCCI IN CHAINS IN BOTH AEROBIC AND ANAEROBIC  BOTTLES CRITICAL RESULT CALLED TO, READ BACK BY AND VERIFIED WITH: Corliss Parish [id number] MLM    Culture ENTEROCOCCUS FAECALIS (A)  Final   Report Status 05/26/2017 FINAL  Final   Organism ID, Bacteria ENTEROCOCCUS FAECALIS  Final      Susceptibility   Enterococcus faecalis - MIC*    AMPICILLIN <=2 SENSITIVE Sensitive     VANCOMYCIN 1 SENSITIVE Sensitive     GENTAMICIN SYNERGY SENSITIVE Sensitive     * ENTEROCOCCUS FAECALIS  Blood Culture ID Panel (Reflexed)     Status: Abnormal   Collection Time: 05/23/17  7:05 PM  Result Value Ref Range Status   Enterococcus species DETECTED (A) NOT DETECTED Final    Comment: CRITICAL RESULT CALLED TO, READ BACK BY AND VERIFIED WITH: PHARMD N BATCHELDER [id number] MLM    Vancomycin resistance NOT DETECTED NOT DETECTED Final   Listeria monocytogenes NOT DETECTED NOT DETECTED Final   Staphylococcus species NOT DETECTED NOT DETECTED Final   Staphylococcus aureus NOT DETECTED NOT DETECTED Final   Methicillin resistance NOT DETECTED NOT DETECTED Final   Streptococcus species NOT DETECTED NOT DETECTED Final   Streptococcus agalactiae NOT DETECTED NOT DETECTED Final   Streptococcus pneumoniae NOT DETECTED NOT DETECTED Final   Streptococcus pyogenes NOT DETECTED NOT DETECTED Final   Acinetobacter baumannii NOT DETECTED NOT DETECTED Final   Enterobacteriaceae species NOT DETECTED NOT DETECTED Final   Enterobacter cloacae complex NOT DETECTED NOT DETECTED Final   Escherichia coli NOT DETECTED NOT DETECTED Final   Klebsiella oxytoca NOT DETECTED NOT DETECTED Final   Klebsiella pneumoniae NOT DETECTED NOT DETECTED Final   Proteus species NOT DETECTED NOT DETECTED Final   Serratia marcescens NOT DETECTED NOT DETECTED Final   Carbapenem resistance NOT DETECTED NOT DETECTED Final   Haemophilus influenzae NOT DETECTED NOT DETECTED Final   Neisseria meningitidis NOT DETECTED NOT DETECTED Final   Pseudomonas aeruginosa NOT DETECTED NOT  DETECTED Final    Candida albicans NOT DETECTED NOT DETECTED Final   Candida glabrata NOT DETECTED NOT DETECTED Final   Candida krusei NOT DETECTED NOT DETECTED Final   Candida parapsilosis NOT DETECTED NOT DETECTED Final   Candida tropicalis NOT DETECTED NOT DETECTED Final  Culture, blood (Routine X 2) w Reflex to ID Panel     Status: None (Preliminary result)   Collection Time: 05/24/17  3:20 PM  Result Value Ref Range Status   Specimen Description BLOOD RIGHT HAND  Final   Special Requests   Final    BOTTLES DRAWN AEROBIC AND ANAEROBIC Blood Culture adequate volume   Culture NO GROWTH 4 DAYS  Final   Report Status PENDING  Incomplete  Culture, blood (Routine X 2) w Reflex to ID Panel     Status: None (Preliminary result)   Collection Time: 05/24/17  3:30 PM  Result Value Ref Range Status   Specimen Description BLOOD LEFT HAND  Final   Special Requests   Final    BOTTLES DRAWN AEROBIC AND ANAEROBIC Blood Culture adequate volume   Culture  Setup Time   Final    GRAM POSITIVE COCCOBACILLUS ANAEROBIC BOTTLE ONLY CRITICAL RESULT CALLED TO, READ BACK BY AND VERIFIED WITH: M TURNER 05/28/17 @ 0857 M VESTAL Organism ID to follow    Culture CULTURE REINCUBATED FOR BETTER GROWTH  Final   Report Status PENDING  Incomplete    Studies/Results: No results found.   Assessment/Plan: Enterococcal bacteremia G+R and Gram Variable anaerobe on BCx 9-3.  PEN allergy Discitis  Total days of antibiotics: 4 vanco/flagyl  One of his anaerobe isolates is enterococcus, the second is clostridia clostridioforme.  He tolerated his augmentin challenge.   Will change him to augmentin. Plan for 6 weeks.  D/i pharm Available as needed.           Johny Sax Infectious Diseases (pager) [phone] [email or web address] 05/28/2017, 3:30 PM  LOS: 4 days

## 2017-05-28 NOTE — Progress Notes (Signed)
PROGRESS NOTE    Dan [name]  UJW:[id number] DOB: 1970/12/24 DOA: 05/23/2017 PCP: Patient, No Pcp Per   Chief Complaint  Patient presents with  . Back Pain    Brief Narrative:  HPI On 05/24/2017 by Dr. Rikki Spearing Powellis a 46 y.o.malewith medical history significant of diabetes mellitus, IV drug use, methamphetamine abuse, tobacco abuse, medication noncompliance, who presents with lower back pain and a urinary incontinence.  Patient states that he has been having lower back pain for about 10 days, which has been progressively getting worse. His back pain is constant, 10 out of 10 in severity, sharp, radiating to left leg posteriorly. He has intermittent urinary incontinence. He denies weakness in the legs. No fever or chills. Patient does not have chest pain, shortness breath, cough, nausea, vomiting, diarrhea, abdominal pain, symptoms of UTI. No headache, photophobia, neck rigidity. [facility] and he refused to have an MRI performed. He ended up having positive blood cultures for Grewgram-negative rods, and was called back today.   Assessment & Plan   Discitis of the lumbosacral region/bacteremia/possible osteomyelitis/bacteremia -MRI concerning for discitis of the L5-S1 region -Currently hemodynamically stable -Blood cultures 1/2 from 05/20/2017 showed gram variable rod -Blood culture 05/23/2017 detected enterococcus faecalis, Susceptibilities pending -Blood cultures from 05/24/2017 show no growth to date -Neurosurgery consultation appreciated, recommended IR for disc aspiration -Discussed with interventional radiology, does not feel patient needs aspiration, continue antibiotic treatment -Echocardiogram EF 55-60%, no RWMA -Infectious disease consulted and appreciated -Continue pain control -was placed on vancomycin/cefepime- transitioned to vanc/flagyl as patient has PCN allergy  -pending further rec from ID- tried patient on one dose of  Augmentin with benadryl, however continues to be on vanc and flagyl. ID recommendeds 6 weeks of antibiotics, no PICC. -PT consulted recommended home health PT  Diabetes mellitus, 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 -Hemoglobin A1c 11.5 05/20/2017 -Patient does not take any medications at home -Presented with blood sugar of 433 -Continue insulin sliding scale, Lantus, CBG monitoring -will increase Lantus to 15u  Polysubstance abuse  -Toxicology screen positive for amphetamines (patient has no home medications) -Discussed cessation from all illicit drugs as well as tobacco use -Continue nicotine patch  Normocytic anemia -Hemoglobin currently 11.1, baseline approximately 12-14 -Continue to monitor CBC  Hypokalemia -Possibly secondary to insulin -Resolved, continue to monitor BMP  DVT Prophylaxis  SCDs  Code Status: Full  Family Communication: None at bedside  Disposition Plan:  Admitted. Pending recommendations regarding antibiotics and length from ID  Consultants Neurourgery Interventional radiology Infectious disease  Procedures  Echocardiogram   Antibiotics   Anti-infectives    Start     Dose/Rate Route Frequency Ordered Stop   05/27/17 1730  amoxicillin-clavulanate (AUGMENTIN) 875-125 MG per tablet 1 tablet     1 tablet Oral  Once 05/27/17 1623 05/27/17 1757   05/27/17 1600  vancomycin (VANCOCIN) 1,750 mg in sodium chloride 0.9 % 500 mL IVPB     1,750 mg 250 mL/hr over 120 Minutes Intravenous Every 8 hours 05/27/17 1521     05/26/17 1400  vancomycin (VANCOCIN) 1,500 mg in sodium chloride 0.9 % 500 mL IVPB  Status:  Discontinued     1,500 mg 250 mL/hr over 120 Minutes Intravenous Every 8 hours 05/26/17 1308 05/26/17 1308   05/26/17 1400  Ampicillin-Sulbactam (UNASYN) 3 g in sodium chloride 0.9 % 100 mL IVPB  Status:  Discontinued     3 g 200 mL/hr over 30 Minutes Intravenous Every 6 hours 05/26/17  1315 05/26/17 1324   05/26/17 1400  vancomycin (VANCOCIN)  1,500 mg in sodium chloride 0.9 % 500 mL IVPB  Status:  Discontinued     1,500 mg 250 mL/hr over 120 Minutes Intravenous Every 8 hours 05/26/17 1324 05/27/17 1521   05/26/17 1400  metroNIDAZOLE (FLAGYL) tablet 500 mg     500 mg Oral Every 8 hours 05/26/17 1324     05/24/17 1400  ceFEPIme (MAXIPIME) 1 g in dextrose 5 % 50 mL IVPB  Status:  Discontinued     1 g 100 mL/hr over 30 Minutes Intravenous Every 8 hours 05/24/17 0503 05/26/17 1308   05/24/17 1200  vancomycin (VANCOCIN) IVPB 1000 mg/200 mL premix  Status:  Discontinued     1,000 mg 200 mL/hr over 60 Minutes Intravenous Every 8 hours 05/24/17 0503 05/26/17 1308   05/24/17 0400  vancomycin (VANCOCIN) 1,500 mg in sodium chloride 0.9 % 500 mL IVPB     1,500 mg 250 mL/hr over 120 Minutes Intravenous  Once 05/24/17 0353 05/24/17 0706   05/24/17 0400  ceFEPIme (MAXIPIME) 2 g in dextrose 5 % 50 mL IVPB     2 g 100 mL/hr over 30 Minutes Intravenous  Once 05/24/17 0353 05/24/17 0455      Subjective:   Dan [name] seen and examined today.  Continues to complain of back pain. Denies chest pain, shortness of breath, abdominal pain, N/V/D/C.  Objective:   Vitals:   05/27/17 0517 05/27/17 1500 05/27/17 2155 05/28/17 0659  BP: (!) 142/79 126/75 114/70 (!) 137/92  Pulse: 75 84 74 74  Resp: Temp: 98.5 F (36.9 C) 98 F (36.7 C) 98.7 F (37.1 C) (!) 97.3 F (36.3 C)  TempSrc: Oral Oral Oral Oral  SpO2: 98% 98% 99% 100%  Weight:      Height:        Intake/Output Summary (Last 24 hours) at 05/28/17 1523 Last data filed at 05/28/17 0659  Gross per 24 hour  Intake             1240 ml  Output              900 ml  Net              340 ml   Filed Weights   05/24/17 0645  Weight: 86.2 kg (190 lb 0.6 oz)   Exam  General: Well developed, well nourished, NAD, appears stated age  HEENT: NCAT, mucous membranes moist.   Cardiovascular: S1 S2 auscultated, RRR, no murmurs  Respiratory: Clear to auscultation bilaterally  with equal chest rise  Abdomen: Soft, nontender, nondistended, + bowel sounds  Extremities: warm dry without cyanosis clubbing or edema  Neuro: AAOx3, nonfocal  Psych: Appropriate  Data Reviewed: I have personally reviewed following labs and imaging studies  CBC:  Recent Labs Lab 05/22/17 0250 05/23/17 1859 05/24/17 0512 05/26/17 0346 05/28/17 0602  WBC 9.7 11.3* 10.1 7.6 8.9  NEUTROABS 7.3 9.6*  --   --   --   HGB 12.4* 12.6* 11.9* 11.2* 11.1*  HCT 37.2* 37.7* 36.1* 34.5* 34.0*  MCV 87.7 86.5 87.4 86.9 87.2  PLT 123* 141* 143* 165 212   Basic Metabolic Panel:  Recent Labs Lab 05/23/17 1859 05/24/17 0512 05/26/17 0346 05/27/17 0230 05/28/17 0602  NA 136 137 136 137 135  K 3.9 3.7 3.4* 3.6 3.9  CL 103 106 99* 99* 98*  CO2 GLUCOSE 477* 289* 283* 188* 236*  BUN 15 9 8 7 7   CREATININE 0.73 0.59* 0.61 0.53* 0.54*  CALCIUM 8.6* 8.5* 8.6* 8.6* 8.5*   GFR: Estimated Creatinine Clearance: 124.2 mL/min (A) (by C-G formula based on SCr of 0.54 mg/dL (L)). Liver Function Tests:  Recent Labs Lab 05/23/17 1859  AST 20  ALT 15*  ALKPHOS 65  BILITOT 0.8  PROT 6.4*  ALBUMIN 3.1*   No results for input(s): LIPASE, AMYLASE in the last 168 hours. No results for input(s): AMMONIA in the last 168 hours. Coagulation Profile:  Recent Labs Lab 05/23/17 1859  INR 0.92   Cardiac Enzymes: No results for input(s): CKTOTAL, CKMB, CKMBINDEX, TROPONINI in the last 168 hours. BNP (last 3 results) No results for input(s): PROBNP in the last 8760 hours. HbA1C: No results for input(s): HGBA1C in the last 72 hours. CBG:  Recent Labs Lab 05/27/17 1136 05/27/17 1734 05/27/17 2150 05/28/17 0736 05/28/17 1211  GLUCAP 201* 406* 176* 216* 297*   Lipid Profile: No results for input(s): CHOL, HDL, LDLCALC, TRIG, CHOLHDL, LDLDIRECT in the last 72 hours. Thyroid Function Tests: No results for input(s): TSH, T4TOTAL, FREET4, T3FREE, THYROIDAB in the last 72  hours. Anemia Panel: No results for input(s): VITAMINB12, FOLATE, FERRITIN, TIBC, IRON, RETICCTPCT in the last 72 hours. Urine analysis:    Component Value Date/Time   COLORURINE YELLOW 05/23/2017 2306   APPEARANCEUR CLEAR 05/23/2017 2306   LABSPEC 1.035 (H) 05/23/2017 2306   PHURINE 5.0 05/23/2017 2306   GLUCOSEU >=500 (A) 05/23/2017 2306   HGBUR NEGATIVE 05/23/2017 2306   BILIRUBINUR NEGATIVE 05/23/2017 2306   KETONESUR 5 (A) 05/23/2017 2306   PROTEINUR NEGATIVE 05/23/2017 2306   UROBILINOGEN 2.0 (H) 07/30/2015 2258   NITRITE NEGATIVE 05/23/2017 2306   LEUKOCYTESUR NEGATIVE 05/23/2017 2306   Sepsis Labs: @LABRCNTIP (procalcitonin:4,lacticidven:4)  ) Recent Results (from the past 240 hour(s))  Culture, blood (Routine X 2) w Reflex to ID Panel     Status: Abnormal   Collection Time: 05/20/17  2:05 PM  Result Value Ref Range Status   Specimen Description BLOOD RIGHT HAND  Final   Special Requests   Final    BOTTLES DRAWN AEROBIC AND ANAEROBIC Blood Culture adequate volume   Culture  Setup Time (A)  Final    GRAM VARIABLE ROD ANAEROBIC BOTTLE ONLY CRITICAL RESULT CALLED TO, READ BACK BY AND VERIFIED WITH: S COBLE RN 2303 05/22/17 A BROWNING    Culture (A)  Final    ANAEROBIC GRAM POSITIVE RODS UNABLE TO FURTHER IDENTIFY    Report Status 05/25/2017 FINAL  Final  Blood Culture ID Panel (Reflexed)     Status: None   Collection Time: 05/20/17  2:05 PM  Result Value Ref Range Status   Enterococcus species NOT DETECTED NOT DETECTED Final   Vancomycin resistance NOT DETECTED NOT DETECTED Final   Listeria monocytogenes NOT DETECTED NOT DETECTED Final   Staphylococcus species NOT DETECTED NOT DETECTED Final   Staphylococcus aureus NOT DETECTED NOT DETECTED Final   Methicillin resistance NOT DETECTED NOT DETECTED Final   Streptococcus species NOT DETECTED NOT DETECTED Final   Streptococcus agalactiae NOT DETECTED NOT DETECTED Final   Streptococcus pneumoniae NOT DETECTED NOT  DETECTED Final   Streptococcus pyogenes NOT DETECTED NOT DETECTED Final   Acinetobacter baumannii NOT DETECTED NOT DETECTED Final   Enterobacteriaceae species NOT DETECTED NOT DETECTED Final   Enterobacter cloacae complex NOT DETECTED NOT DETECTED Final   Escherichia coli NOT DETECTED NOT DETECTED Final   Klebsiella oxytoca NOT DETECTED NOT DETECTED Final  Klebsiella pneumoniae NOT DETECTED NOT DETECTED Final   Proteus species NOT DETECTED NOT DETECTED Final   Serratia marcescens NOT DETECTED NOT DETECTED Final   Carbapenem resistance NOT DETECTED NOT DETECTED Final   Haemophilus influenzae NOT DETECTED NOT DETECTED Final   Neisseria meningitidis NOT DETECTED NOT DETECTED Final   Pseudomonas aeruginosa NOT DETECTED NOT DETECTED Final   Candida albicans NOT DETECTED NOT DETECTED Final   Candida glabrata NOT DETECTED NOT DETECTED Final   Candida krusei NOT DETECTED NOT DETECTED Final   Candida parapsilosis NOT DETECTED NOT DETECTED Final   Candida tropicalis NOT DETECTED NOT DETECTED Final  Culture, blood (Routine X 2) w Reflex to ID Panel     Status: Abnormal   Collection Time: 05/20/17  2:10 PM  Result Value Ref Range Status   Specimen Description BLOOD RIGHT ANTECUBITAL  Final   Special Requests   Final    BOTTLES DRAWN AEROBIC AND ANAEROBIC Blood Culture adequate volume   Culture  Setup Time   Final    GRAM NEGATIVE RODS ANAEROBIC BOTTLE ONLY CRITICAL VALUE NOTED.  VALUE IS CONSISTENT WITH PREVIOUSLY REPORTED AND CALLED VALUE.    Culture CLOSTRIDIUM CLOSTRIDIOFORME (A)  Final   Report Status 05/28/2017 FINAL  Final  MRSA PCR Screening     Status: None   Collection Time: 05/20/17  6:27 PM  Result Value Ref Range Status   MRSA by PCR NEGATIVE NEGATIVE Final    Comment:        The GeneXpert MRSA Assay (FDA approved for NASAL specimens only), is one component of a comprehensive MRSA colonization surveillance program. It is not intended to diagnose MRSA infection nor to guide  or monitor treatment for MRSA infections.   Culture, blood (Routine x 2)     Status: Abnormal   Collection Time: 05/23/17  6:59 PM  Result Value Ref Range Status   Specimen Description BLOOD RIGHT ANTECUBITAL  Final   Special Requests   Final    BOTTLES DRAWN AEROBIC AND ANAEROBIC Blood Culture adequate volume   Culture  Setup Time   Final    GRAM POSITIVE COCCI IN CHAINS IN BOTH AEROBIC AND ANAEROBIC BOTTLES CRITICAL VALUE NOTED.  VALUE IS CONSISTENT WITH PREVIOUSLY REPORTED AND CALLED VALUE.    Culture (A)  Final    ENTEROCOCCUS FAECALIS SUSCEPTIBILITIES PERFORMED ON PREVIOUS CULTURE WITHIN THE LAST 5 DAYS.    Report Status 05/26/2017 FINAL  Final  Culture, blood (Routine x 2)     Status: Abnormal   Collection Time: 05/23/17  7:05 PM  Result Value Ref Range Status   Specimen Description BLOOD LEFT ANTECUBITAL  Final   Special Requests   Final    BOTTLES DRAWN AEROBIC AND ANAEROBIC Blood Culture adequate volume   Culture  Setup Time   Final    GRAM POSITIVE COCCI IN CHAINS IN BOTH AEROBIC AND ANAEROBIC BOTTLES CRITICAL RESULT CALLED TO, READ BACK BY AND VERIFIED WITH: Corliss Parish [id number] MLM    Culture ENTEROCOCCUS FAECALIS (A)  Final   Report Status 05/26/2017 FINAL  Final   Organism ID, Bacteria ENTEROCOCCUS FAECALIS  Final      Susceptibility   Enterococcus faecalis - MIC*    AMPICILLIN <=2 SENSITIVE Sensitive     VANCOMYCIN 1 SENSITIVE Sensitive     GENTAMICIN SYNERGY SENSITIVE Sensitive     * ENTEROCOCCUS FAECALIS  Blood Culture ID Panel (Reflexed)     Status: Abnormal   Collection Time: 05/23/17  7:05 PM  Result Value  Ref Range Status   Enterococcus species DETECTED (A) NOT DETECTED Final    Comment: CRITICAL RESULT CALLED TO, READ BACK BY AND VERIFIED WITH: PHARMD N BATCHELDER [id number] MLM    Vancomycin resistance NOT DETECTED NOT DETECTED Final   Listeria monocytogenes NOT DETECTED NOT DETECTED Final   Staphylococcus species NOT DETECTED NOT  DETECTED Final   Staphylococcus aureus NOT DETECTED NOT DETECTED Final   Methicillin resistance NOT DETECTED NOT DETECTED Final   Streptococcus species NOT DETECTED NOT DETECTED Final   Streptococcus agalactiae NOT DETECTED NOT DETECTED Final   Streptococcus pneumoniae NOT DETECTED NOT DETECTED Final   Streptococcus pyogenes NOT DETECTED NOT DETECTED Final   Acinetobacter baumannii NOT DETECTED NOT DETECTED Final   Enterobacteriaceae species NOT DETECTED NOT DETECTED Final   Enterobacter cloacae complex NOT DETECTED NOT DETECTED Final   Escherichia coli NOT DETECTED NOT DETECTED Final   Klebsiella oxytoca NOT DETECTED NOT DETECTED Final   Klebsiella pneumoniae NOT DETECTED NOT DETECTED Final   Proteus species NOT DETECTED NOT DETECTED Final   Serratia marcescens NOT DETECTED NOT DETECTED Final   Carbapenem resistance NOT DETECTED NOT DETECTED Final   Haemophilus influenzae NOT DETECTED NOT DETECTED Final   Neisseria meningitidis NOT DETECTED NOT DETECTED Final   Pseudomonas aeruginosa NOT DETECTED NOT DETECTED Final   Candida albicans NOT DETECTED NOT DETECTED Final   Candida glabrata NOT DETECTED NOT DETECTED Final   Candida krusei NOT DETECTED NOT DETECTED Final   Candida parapsilosis NOT DETECTED NOT DETECTED Final   Candida tropicalis NOT DETECTED NOT DETECTED Final  Culture, blood (Routine X 2) w Reflex to ID Panel     Status: None (Preliminary result)   Collection Time: 05/24/17  3:20 PM  Result Value Ref Range Status   Specimen Description BLOOD RIGHT HAND  Final   Special Requests   Final    BOTTLES DRAWN AEROBIC AND ANAEROBIC Blood Culture adequate volume   Culture NO GROWTH 4 DAYS  Final   Report Status PENDING  Incomplete  Culture, blood (Routine X 2) w Reflex to ID Panel     Status: None (Preliminary result)   Collection Time: 05/24/17  3:30 PM  Result Value Ref Range Status   Specimen Description BLOOD LEFT HAND  Final   Special Requests   Final    BOTTLES DRAWN  AEROBIC AND ANAEROBIC Blood Culture adequate volume   Culture  Setup Time   Final    GRAM POSITIVE COCCOBACILLUS ANAEROBIC BOTTLE ONLY CRITICAL RESULT CALLED TO, READ BACK BY AND VERIFIED WITH: M TURNER 05/28/17 @ 0857 M VESTAL Organism ID to follow    Culture CULTURE REINCUBATED FOR BETTER GROWTH  Final   Report Status PENDING  Incomplete      Radiology Studies: No results found.   Scheduled Meds: . insulin aspart  0-9 Units Subcutaneous TID WC  . insulin glargine  12 Units Subcutaneous Daily  . metroNIDAZOLE  500 mg Oral Q8H  . nicotine  21 mg Transdermal Daily   Continuous Infusions: . vancomycin Stopped (05/28/17 1323)     LOS: 4 days   Time Spent in minutes   30 minutes  Jarika Robben D.O. on 05/28/2017 at 3:23 PM  Between 7am to 7pm - Pager - [phone]  After 7pm go to [email or web address] - password [email or web address]  And look for the night coverage person covering for me after hours  [facility] Office  [phone]

## 2017-05-28 NOTE — Progress Notes (Signed)
PT Cancellation Note  Patient Details Name: Dan GaleaJohn W [name] MRN: [id number] DOB: [birth year]   Cancelled Treatment:    Reason Eval/Treat Not Completed: Other (comment). Pt sitting EOB giving himself a sponge bath. Declines needing help with the bath, but states he is not feeling well and asks that PT holds off on treatment today. Will follow-up tomorrow.  Ina HomesJaclyn Emylia Latella, PT, DPT Acute Rehab Services  Pager: [phone]  Malachy ChamberJaclyn L Jaber Dunlow 05/28/2017, 2:31 PM

## 2017-05-28 NOTE — Progress Notes (Signed)
PHARMACY - PHYSICIAN COMMUNICATION CRITICAL VALUE ALERT - BLOOD CULTURE IDENTIFICATION (BCID)  Called by [facility] to report his blood culture from 9/7 is growing Gram positive coccobacillus that appears consistent with his previous results. BCID was not repeated. He is being treated with Vancomycin and Flagyl for Enterococcus bacteremia and an anaerobe Gram variable rod that has yet to be identified.  He is being seen by the ID team.  Name of physician (or Provider) Contacted: Dr. Ninetta LightsHatcher  Changes to prescribed antibiotics required: None - continue Vancomycin and Flagyl After discussing with Dr Ninetta LightsHatcher will ask [facility] to run the BCID  Sallee Provencalurner, Kenishia Plack S 05/28/2017  9:15 AM

## 2017-05-28 NOTE — Progress Notes (Signed)
Patient has not had a bowel movement for "several days" according to him. Miralax was given but patient states he is scared to take laxatives because his back hurts too mad to move.

## 2017-05-29 LAB — BASIC METABOLIC PANEL
Anion gap: 7 (ref 5–15)
BUN: 6 mg/dL (ref 6–20)
CALCIUM: 8.7 mg/dL — AB (ref 8.9–10.3)
CO2: 29 mmol/L (ref 22–32)
Chloride: 100 mmol/L — ABNORMAL LOW (ref 101–111)
Creatinine, Ser: 0.63 mg/dL (ref 0.61–1.24)
GFR calc Af Amer: >60 mL/min (ref >60)
GLUCOSE: 217 mg/dL — AB (ref 65–99)
POTASSIUM: 4.2 mmol/L (ref 3.5–5.1)
SODIUM: 136 mmol/L (ref 135–145)

## 2017-05-29 LAB — CBC
HCT: 36.5 % — ABNORMAL LOW (ref 39.0–52.0)
Hemoglobin: 11.8 g/dL — ABNORMAL LOW (ref 13.0–17.0)
MCH: 28.4 pg (ref 26.0–34.0)
MCHC: 32.3 g/dL (ref 30.0–36.0)
MCV: 88 fL (ref 78.0–100.0)
PLATELETS: 244 10*3/uL (ref 150–400)
RBC: 4.15 MIL/uL — AB (ref 4.22–5.81)
RDW: 12.9 % (ref 11.5–15.5)
WBC: 7.8 10*3/uL (ref 4.0–10.5)

## 2017-05-29 LAB — CULTURE, BLOOD (ROUTINE X 2)
CULTURE: NO GROWTH
SPECIAL REQUESTS: ADEQUATE

## 2017-05-29 LAB — GLUCOSE, CAPILLARY
GLUCOSE-CAPILLARY: 203 mg/dL — AB (ref 65–99)
GLUCOSE-CAPILLARY: 251 mg/dL — AB (ref 65–99)
GLUCOSE-CAPILLARY: 275 mg/dL — AB (ref 65–99)
Glucose-Capillary: 198 mg/dL — ABNORMAL HIGH (ref 65–99)

## 2017-05-29 MED ORDER — MORPHINE SULFATE (PF) 4 MG/ML IV SOLN
1.0000 mg | INTRAVENOUS | Status: DC | PRN
  Administered 2017-05-29 (×3): 1 mg via INTRAVENOUS
  Filled 2017-05-29 (×4): qty 1

## 2017-05-29 MED ORDER — OXYCODONE-ACETAMINOPHEN 5-325 MG PO TABS
2.0000 | ORAL_TABLET | ORAL | Status: DC
  Administered 2017-05-29 – 2017-05-31 (×14): 2 via ORAL
  Filled 2017-05-29 (×14): qty 2

## 2017-05-29 MED ORDER — METFORMIN HCL 500 MG PO TABS
500.0000 mg | ORAL_TABLET | Freq: Two times a day (BID) | ORAL | Status: DC
  Administered 2017-05-29 – 2017-05-31 (×5): 500 mg via ORAL
  Filled 2017-05-29 (×5): qty 1

## 2017-05-29 MED ORDER — INSULIN STARTER KIT- PEN NEEDLES (ENGLISH)
1.0000 | Freq: Once | Status: DC
  Filled 2017-05-29: qty 1

## 2017-05-29 MED ORDER — IBUPROFEN 200 MG PO TABS
600.0000 mg | ORAL_TABLET | Freq: Four times a day (QID) | ORAL | Status: AC
  Administered 2017-05-29 – 2017-05-30 (×8): 600 mg via ORAL
  Filled 2017-05-29 (×8): qty 3

## 2017-05-29 NOTE — Progress Notes (Signed)
Physical Therapy Treatment Patient Details Name: Dan [name] MRN: [id number] DOB: 08/28/1971 Today's Date: 05/29/2017    History of Present Illness 46 y.o. male with medical history significant of diabetes mellitus, IV drug use, methamphetamine abuse, tobacco abuse, medication noncompliance, admitted with lower back pain, urinary incontinence- discitis    PT Comments    Pt hesitant to mobilize this afternoon.  Required administration of IV pain meds to participate this pm.  Pt able to advance gait distance but remains slow and guarded.  Pt required re-education of spinal precautions and cueing throughout to avoid twisting movements.    Follow Up Recommendations  Home health PT     Equipment Recommendations  Rolling walker with 5" wheels;3in1 (PT)    Recommendations for Other Services OT consult     Precautions / Restrictions Precautions Precautions: Fall;Back Precaution Booklet Issued: Yes (comment) Precaution Comments: back for pain management Restrictions Weight Bearing Restrictions: No    Mobility  Bed Mobility Overal bed mobility: Needs Assistance Bed Mobility: Rolling;Sidelying to Sit;Sit to Sidelying Rolling: Min assist Sidelying to sit: Min assist     Sit to sidelying: Mod assist General bed mobility comments: Pt required cues for logrolling, hand placement and trunk elevation.  Pt attempting twisting motions and required max VCs to defer twisting.    Transfers Overall transfer level: Needs assistance Equipment used: Rolling walker (2 wheeled) Transfers: Sit to/from Stand Sit to Stand: Min assist         General transfer comment: Pt required assist to boost into standing, patient hesistant to trust his legs.  Cues for forward weght shifting and pushing with arms and legs.    Ambulation/Gait Ambulation/Gait assistance: Min assist Ambulation Distance (Feet): 50 Feet Assistive device: Rolling walker (2 wheeled) Gait Pattern/deviations: Step-through  pattern;Decreased stride length;Trunk flexed   Gait velocity interpretation: Below normal speed for age/gender General Gait Details: Pt remains slow and cautious with heavy reliance on UEs for support.  Pt required assistance to turn RW.  Pt required cues for forward head gaze and to progress ambulation distance.     Stairs            Wheelchair Mobility    Modified Rankin (Stroke Patients Only)       Balance Overall balance assessment: Needs assistance   Sitting balance-Leahy Scale: Fair       Standing balance-Leahy Scale: Poor Standing balance comment: Heavy reliance on RW for standing and ambulation.                              Cognition Arousal/Alertness: Awake/alert Behavior During Therapy: WFL for tasks assessed/performed Overall Cognitive Status: Within Functional Limits for tasks assessed                                        Exercises      General Comments        Pertinent Vitals/Pain Pain Assessment: 0-10 Pain Location: lower back Pain Descriptors / Indicators: Throbbing Pain Intervention(s): Monitored during session;Repositioned;Patient requesting pain meds-RN notified;RN gave pain meds during session    Home Living                      Prior Function            PT Goals (current goals can now be found in the care plan section)  Acute Rehab PT Goals Potential to Achieve Goals: Good Progress towards PT goals: Progressing toward goals    Frequency           PT Plan Current plan remains appropriate    Co-evaluation              AM-PAC PT "6 Clicks" Daily Activity  Outcome Measure  Difficulty turning over in bed (including adjusting bedclothes, sheets and blankets)?: Unable Difficulty moving from lying on back to sitting on the side of the bed? : Unable Difficulty sitting down on and standing up from a chair with arms (e.g., wheelchair, bedside commode, etc,.)?: Unable Help needed moving  to and from a bed to chair (including a wheelchair)?: A Little Help needed walking in hospital room?: A Little Help needed climbing 3-5 steps with a railing? : A Lot 6 Click Score: 11    End of Session Equipment Utilized During Treatment: Gait belt Activity Tolerance: Patient limited by pain Patient left: in bed;with call bell/phone within reach Nurse Communication: Mobility status;Precautions PT Visit Diagnosis: Other abnormalities of gait and mobility (R26.89);Difficulty in walking, not elsewhere classified (R26.2)     Time: 1500-1536 PT Time Calculation (min) (ACUTE ONLY): 36 min  Charges:  $Gait Training: 8-22 mins $Therapeutic Activity: 8-22 mins                    G Codes:       Dan [name], PTA pager [phone]    Dan [name] 05/29/2017, 3:45 PM

## 2017-05-29 NOTE — Progress Notes (Signed)
Instructed patient on insulin pen usage.  Patient returned demonstration well.  Will need reinforcement of administering insulin before discharge. Discussed HgbA1C of 11.5%.  States that it has been 11% in the past. Will continue to monitor bloods sugars while in the hospital.  Smith MinceKendra Nik Gorrell RN BSN CDE Diabetes Coordinator Pager: [phone]  8am-5pm

## 2017-05-29 NOTE — Progress Notes (Signed)
PROGRESS NOTE    Dan [name] W [name]  ZOX:[id number] DOB: 1971-01-16 DOA: 05/23/2017 PCP: Patient, No Pcp Per   Chief Complaint  Patient presents with  . Back Pain    Brief Narrative:  HPI On 05/24/2017 by Dr. Lorretta HarpXilin Niu 45 y.o.malewith medical history significant of diabetes mellitus, IV drug use, methamphetamine abuse, tobacco abuse, medication noncompliance, who presents with lower back pain and a urinary incontinence.  Patient states that he has been having lower back pain for about 10 days, which has been progressively getting worse. His back pain is constant, 10 out of 10 in severity, sharp, radiating to left leg posteriorly. He has intermittent urinary incontinence. He denies weakness in the legs. No fever or chills. Patient does not have chest pain, shortness breath, cough, nausea, vomiting, diarrhea, abdominal pain, symptoms of UTI. No headache, photophobia, neck rigidity. [facility] and he refused to have an MRI performed. He ended up having positive blood cultures for Grewgram-negative rods, and was called back today.   Assessment & Plan   Discitis of the lumbosacral region/bacteremia/possible osteomyelitis/bacteremia -MRI concerning for discitis of the L5-S1 region -Currently hemodynamically stable -Blood cultures 1/2 from 05/20/2017 showed gram variable rod -Blood culture 05/23/2017 detected enterococcus faecalis, Susceptibilities pending -Blood cultures from 05/24/2017 show no growth to date -Neurosurgery consultation appreciated, recommended IR for disc aspiration, per interventional radiology, does not feel patient needs aspiration, continue antibiotic treatment -Echocardiogram EF 55-60%, no RWMA -Infectious disease consulted and appreciated -Continue pain control--changed to 2 tabs scheduled percocet q4, added ibu 600 and kpad, limited IV morphine dosing -was placed on vancomycin/cefepime- transitioned to vanc/flagyl as patient has PCN allergy--  ID recommendeds 6 weeks of antibiotics, no PICC--tolerating Augemtnin well -PT consulted recommended home health PT  Diabetes mellitus, 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 -Hemoglobin A1c 11.5 05/20/2017 -Patient does not take any medications at home -Presented with blood sugar of 433 -Continue insulin sliding scale, Lantus, CBG monitoring -will increase Lantus to 15u -diabetic teaching is needed, will be going home with insulin, added metformin 500 bid 9/12  Polysubstance abuse  -Toxicology screen positive for amphetamines (patient has no home medications) -Discussed cessation from all illicit drugs as well as tobacco use -Continue nicotine patch  Normocytic anemia -Hemoglobin currently 11.1, baseline approximately 12-14 -Continue to monitor CBC  Hypokalemia -Possibly secondary to insulin -Resolved, continue to monitor BMP  DVT Prophylaxis  SCDs  Code Status: Full  Family Communication: None at bedside  Disposition Plan:  home 48 hours once insulin traching and toleracne of mobility--hasnt really been OOB  Consultants Neurourgery Interventional radiology Infectious disease  Procedures  Echocardiogram   Antibiotics   Anti-infectives    Start     Dose/Rate Route Frequency Ordered Stop   05/28/17 1545  amoxicillin-clavulanate (AUGMENTIN) 875-125 MG per tablet 1 tablet     1 tablet Oral Every 12 hours 05/28/17 1542     05/27/17 1730  amoxicillin-clavulanate (AUGMENTIN) 875-125 MG per tablet 1 tablet     1 tablet Oral  Once 05/27/17 1623 05/27/17 1757   05/27/17 1600  vancomycin (VANCOCIN) 1,750 mg in sodium chloride 0.9 % 500 mL IVPB  Status:  Discontinued     1,750 mg 250 mL/hr over 120 Minutes Intravenous Every 8 hours 05/27/17 1521 05/28/17 1542   05/26/17 1400  vancomycin (VANCOCIN) 1,500 mg in sodium chloride 0.9 % 500 mL IVPB  Status:  Discontinued     1,500 mg 250 mL/hr over 120 Minutes Intravenous Every 8 hours 05/26/17 1308  05/26/17 1308   05/26/17  1400  Ampicillin-Sulbactam (UNASYN) 3 g in sodium chloride 0.9 % 100 mL IVPB  Status:  Discontinued     3 g 200 mL/hr over 30 Minutes Intravenous Every 6 hours 05/26/17 1315 05/26/17 1324   05/26/17 1400  vancomycin (VANCOCIN) 1,500 mg in sodium chloride 0.9 % 500 mL IVPB  Status:  Discontinued     1,500 mg 250 mL/hr over 120 Minutes Intravenous Every 8 hours 05/26/17 1324 05/27/17 1521   05/26/17 1400  metroNIDAZOLE (FLAGYL) tablet 500 mg  Status:  Discontinued     500 mg Oral Every 8 hours 05/26/17 1324 05/28/17 1542   05/24/17 1400  ceFEPIme (MAXIPIME) 1 g in dextrose 5 % 50 mL IVPB  Status:  Discontinued     1 g 100 mL/hr over 30 Minutes Intravenous Every 8 hours 05/24/17 0503 05/26/17 1308   05/24/17 1200  vancomycin (VANCOCIN) IVPB 1000 mg/200 mL premix  Status:  Discontinued     1,000 mg 200 mL/hr over 60 Minutes Intravenous Every 8 hours 05/24/17 0503 05/26/17 1308   05/24/17 0400  vancomycin (VANCOCIN) 1,500 mg in sodium chloride 0.9 % 500 mL IVPB     1,500 mg 250 mL/hr over 120 Minutes Intravenous  Once 05/24/17 0353 05/24/17 0706   05/24/17 0400  ceFEPIme (MAXIPIME) 2 g in dextrose 5 % 50 mL IVPB     2 g 100 mL/hr over 30 Minutes Intravenous  Once 05/24/17 0353 05/24/17 0455      Subjective:  Continues to complain of back pain, 10/10 . Denies chest pain, shortness of breath, abdominal pain, N/V/D/C. Hasn't been OOb much Works as a Statistician normally  Objective:   Vitals:   05/28/17 0659 05/28/17 1300 05/28/17 2043 05/29/17 0630  BP: (!) 137/92 (!) 124/51 138/78 127/74  Pulse: 74 76 75 67  Resp: Temp: (!) 97.3 F (36.3 C) 98.8 F (37.1 C) 98.6 F (37 C) 98 F (36.7 C)  TempSrc: Oral Oral Oral Oral  SpO2: 100% 100% 100% 99%  Weight:      Height:        Intake/Output Summary (Last 24 hours) at 05/29/17 0931 Last data filed at 05/29/17 0700  Gross per 24 hour  Intake              480 ml  Output             1800 ml  Net            -1320 ml    Filed Weights   05/24/17 0645  Weight: 86.2 kg (190 lb 0.6 oz)   Exam  General: Well developed, well nourished, NAD, appears stated age  HEENT: NCAT, mucous membranes moist.   Cardiovascular: S1 S2 auscultated, RRR, no murmurs  Respiratory: Clear to auscultation bilaterally with equal chest rise  Abdomen: Soft, nontender, nondistended, + bowel sounds  Extremities: warm dry without cyanosis clubbing or edema  Neuro: AAOx3, nonfocal  Psych: Appropriate  msk-able to wiggle toes, inconsistent effort to SLR and cannot axially rotate his spine sagtially as seems to be in severe pain + Lumbar parevertebral muscle spasm  Data Reviewed: I have personally reviewed following labs and imaging studies  CBC:  Recent Labs Lab 05/23/17 1859 05/24/17 0512 05/26/17 0346 05/28/17 0602 05/29/17 0419  WBC 11.3* 10.1 7.6 8.9 7.8  NEUTROABS 9.6*  --   --   --   --   HGB 12.6* 11.9* 11.2* 11.1* 11.8*  HCT 37.7* 36.1* 34.5* 34.0* 36.5*  MCV 86.5 87.4 86.9 87.2 88.0  PLT 141* 143* 165 212 244   Basic Metabolic Panel:  Recent Labs Lab 05/24/17 0512 05/26/17 0346 05/27/17 0230 05/28/17 0602 05/29/17 0419  NA 137 136 137 135 136  K 3.7 3.4* 3.6 3.9 4.2  CL 106 99* 99* 98* 100*  CO2 GLUCOSE 289* 283* 188* 236* 217*  BUN CREATININE 0.59* 0.61 0.53* 0.54* 0.63  CALCIUM 8.5* 8.6* 8.6* 8.5* 8.7*   GFR: Estimated Creatinine Clearance: 124.2 mL/min (by C-G formula based on SCr of 0.63 mg/dL). Liver Function Tests:  Recent Labs Lab 05/23/17 1859  AST 20  ALT 15*  ALKPHOS 65  BILITOT 0.8  PROT 6.4*  ALBUMIN 3.1*   No results for input(s): LIPASE, AMYLASE in the last 168 hours. No results for input(s): AMMONIA in the last 168 hours. Coagulation Profile:  Recent Labs Lab 05/23/17 1859  INR 0.92   Cardiac Enzymes: No results for input(s): CKTOTAL, CKMB, CKMBINDEX, TROPONINI in the last 168 hours. BNP (last 3 results) No results for input(s):  PROBNP in the last 8760 hours. HbA1C: No results for input(s): HGBA1C in the last 72 hours. CBG:  Recent Labs Lab 05/28/17 0736 05/28/17 1211 05/28/17 1649 05/28/17 2048 05/29/17 0624  GLUCAP 216* 297* 230* 173* 203*   Lipid Profile: No results for input(s): CHOL, HDL, LDLCALC, TRIG, CHOLHDL, LDLDIRECT in the last 72 hours. Thyroid Function Tests: No results for input(s): TSH, T4TOTAL, FREET4, T3FREE, THYROIDAB in the last 72 hours. Anemia Panel: No results for input(s): VITAMINB12, FOLATE, FERRITIN, TIBC, IRON, RETICCTPCT in the last 72 hours. Urine analysis:    Component Value Date/Time   COLORURINE YELLOW 05/23/2017 2306   APPEARANCEUR CLEAR 05/23/2017 2306   LABSPEC 1.035 (H) 05/23/2017 2306   PHURINE 5.0 05/23/2017 2306   GLUCOSEU >=500 (A) 05/23/2017 2306   HGBUR NEGATIVE 05/23/2017 2306   BILIRUBINUR NEGATIVE 05/23/2017 2306   KETONESUR 5 (A) 05/23/2017 2306   PROTEINUR NEGATIVE 05/23/2017 2306   UROBILINOGEN 2.0 (H) 07/30/2015 2258   NITRITE NEGATIVE 05/23/2017 2306   LEUKOCYTESUR NEGATIVE 05/23/2017 2306   Sepsis Labs: (procalcitonin:4,lacticidven:4)  ) Recent Results (from the past 240 hour(s))  Culture, blood (Routine X 2) w Reflex to ID Panel     Status: Abnormal   Collection Time: 05/20/17  2:05 PM  Result Value Ref Range Status   Specimen Description BLOOD RIGHT HAND  Final   Special Requests   Final    BOTTLES DRAWN AEROBIC AND ANAEROBIC Blood Culture adequate volume   Culture  Setup Time (A)  Final    GRAM VARIABLE ROD ANAEROBIC BOTTLE ONLY CRITICAL RESULT CALLED TO, READ BACK BY AND VERIFIED WITH: S COBLE RN 2303 05/22/17 A BROWNING    Culture (A)  Final    ANAEROBIC GRAM POSITIVE RODS UNABLE TO FURTHER IDENTIFY    Report Status 05/25/2017 FINAL  Final  Blood Culture ID Panel (Reflexed)     Status: None   Collection Time: 05/20/17  2:05 PM  Result Value Ref Range Status   Enterococcus species NOT DETECTED NOT DETECTED Final    Vancomycin resistance NOT DETECTED NOT DETECTED Final   Listeria monocytogenes NOT DETECTED NOT DETECTED Final   Staphylococcus species NOT DETECTED NOT DETECTED Final   Staphylococcus aureus NOT DETECTED NOT DETECTED Final   Methicillin resistance NOT DETECTED NOT DETECTED Final   Streptococcus species NOT DETECTED NOT DETECTED Final  Streptococcus agalactiae NOT DETECTED NOT DETECTED Final   Streptococcus pneumoniae NOT DETECTED NOT DETECTED Final   Streptococcus pyogenes NOT DETECTED NOT DETECTED Final   Acinetobacter baumannii NOT DETECTED NOT DETECTED Final   Enterobacteriaceae species NOT DETECTED NOT DETECTED Final   Enterobacter cloacae complex NOT DETECTED NOT DETECTED Final   Escherichia coli NOT DETECTED NOT DETECTED Final   Klebsiella oxytoca NOT DETECTED NOT DETECTED Final   Klebsiella pneumoniae NOT DETECTED NOT DETECTED Final   Proteus species NOT DETECTED NOT DETECTED Final   Serratia marcescens NOT DETECTED NOT DETECTED Final   Carbapenem resistance NOT DETECTED NOT DETECTED Final   Haemophilus influenzae NOT DETECTED NOT DETECTED Final   Neisseria meningitidis NOT DETECTED NOT DETECTED Final   Pseudomonas aeruginosa NOT DETECTED NOT DETECTED Final   Candida albicans NOT DETECTED NOT DETECTED Final   Candida glabrata NOT DETECTED NOT DETECTED Final   Candida krusei NOT DETECTED NOT DETECTED Final   Candida parapsilosis NOT DETECTED NOT DETECTED Final   Candida tropicalis NOT DETECTED NOT DETECTED Final  Culture, blood (Routine X 2) w Reflex to ID Panel     Status: Abnormal   Collection Time: 05/20/17  2:10 PM  Result Value Ref Range Status   Specimen Description BLOOD RIGHT ANTECUBITAL  Final   Special Requests   Final    BOTTLES DRAWN AEROBIC AND ANAEROBIC Blood Culture adequate volume   Culture  Setup Time   Final    GRAM NEGATIVE RODS ANAEROBIC BOTTLE ONLY CRITICAL VALUE NOTED.  VALUE IS CONSISTENT WITH PREVIOUSLY REPORTED AND CALLED VALUE.    Culture  CLOSTRIDIUM CLOSTRIDIOFORME (A)  Final   Report Status 05/28/2017 FINAL  Final  MRSA PCR Screening     Status: None   Collection Time: 05/20/17  6:27 PM  Result Value Ref Range Status   MRSA by PCR NEGATIVE NEGATIVE Final    Comment:        The GeneXpert MRSA Assay (FDA approved for NASAL specimens only), is one component of a comprehensive MRSA colonization surveillance program. It is not intended to diagnose MRSA infection nor to guide or monitor treatment for MRSA infections.   Culture, blood (Routine x 2)     Status: Abnormal   Collection Time: 05/23/17  6:59 PM  Result Value Ref Range Status   Specimen Description BLOOD RIGHT ANTECUBITAL  Final   Special Requests   Final    BOTTLES DRAWN AEROBIC AND ANAEROBIC Blood Culture adequate volume   Culture  Setup Time   Final    GRAM POSITIVE COCCI IN CHAINS IN BOTH AEROBIC AND ANAEROBIC BOTTLES CRITICAL VALUE NOTED.  VALUE IS CONSISTENT WITH PREVIOUSLY REPORTED AND CALLED VALUE.    Culture (A)  Final    ENTEROCOCCUS FAECALIS SUSCEPTIBILITIES PERFORMED ON PREVIOUS CULTURE WITHIN THE LAST 5 DAYS.    Report Status 05/26/2017 FINAL  Final  Culture, blood (Routine x 2)     Status: Abnormal   Collection Time: 05/23/17  7:05 PM  Result Value Ref Range Status   Specimen Description BLOOD LEFT ANTECUBITAL  Final   Special Requests   Final    BOTTLES DRAWN AEROBIC AND ANAEROBIC Blood Culture adequate volume   Culture  Setup Time   Final    GRAM POSITIVE COCCI IN CHAINS IN BOTH AEROBIC AND ANAEROBIC BOTTLES CRITICAL RESULT CALLED TO, READ BACK BY AND VERIFIED WITH: Corliss Parish [id number] MLM    Culture ENTEROCOCCUS FAECALIS (A)  Final   Report Status 05/26/2017 FINAL  Final  Organism ID, Bacteria ENTEROCOCCUS FAECALIS  Final      Susceptibility   Enterococcus faecalis - MIC*    AMPICILLIN <=2 SENSITIVE Sensitive     VANCOMYCIN 1 SENSITIVE Sensitive     GENTAMICIN SYNERGY SENSITIVE Sensitive     * ENTEROCOCCUS FAECALIS   Blood Culture ID Panel (Reflexed)     Status: Abnormal   Collection Time: 05/23/17  7:05 PM  Result Value Ref Range Status   Enterococcus species DETECTED (A) NOT DETECTED Final    Comment: CRITICAL RESULT CALLED TO, READ BACK BY AND VERIFIED WITH: PHARMD N BATCHELDER [id number] MLM    Vancomycin resistance NOT DETECTED NOT DETECTED Final   Listeria monocytogenes NOT DETECTED NOT DETECTED Final   Staphylococcus species NOT DETECTED NOT DETECTED Final   Staphylococcus aureus NOT DETECTED NOT DETECTED Final   Methicillin resistance NOT DETECTED NOT DETECTED Final   Streptococcus species NOT DETECTED NOT DETECTED Final   Streptococcus agalactiae NOT DETECTED NOT DETECTED Final   Streptococcus pneumoniae NOT DETECTED NOT DETECTED Final   Streptococcus pyogenes NOT DETECTED NOT DETECTED Final   Acinetobacter baumannii NOT DETECTED NOT DETECTED Final   Enterobacteriaceae species NOT DETECTED NOT DETECTED Final   Enterobacter cloacae complex NOT DETECTED NOT DETECTED Final   Escherichia coli NOT DETECTED NOT DETECTED Final   Klebsiella oxytoca NOT DETECTED NOT DETECTED Final   Klebsiella pneumoniae NOT DETECTED NOT DETECTED Final   Proteus species NOT DETECTED NOT DETECTED Final   Serratia marcescens NOT DETECTED NOT DETECTED Final   Carbapenem resistance NOT DETECTED NOT DETECTED Final   Haemophilus influenzae NOT DETECTED NOT DETECTED Final   Neisseria meningitidis NOT DETECTED NOT DETECTED Final   Pseudomonas aeruginosa NOT DETECTED NOT DETECTED Final   Candida albicans NOT DETECTED NOT DETECTED Final   Candida glabrata NOT DETECTED NOT DETECTED Final   Candida krusei NOT DETECTED NOT DETECTED Final   Candida parapsilosis NOT DETECTED NOT DETECTED Final   Candida tropicalis NOT DETECTED NOT DETECTED Final  Culture, blood (Routine X 2) w Reflex to ID Panel     Status: None (Preliminary result)   Collection Time: 05/24/17  3:20 PM  Result Value Ref Range Status   Specimen Description  BLOOD RIGHT HAND  Final   Special Requests   Final    BOTTLES DRAWN AEROBIC AND ANAEROBIC Blood Culture adequate volume   Culture NO GROWTH 4 DAYS  Final   Report Status PENDING  Incomplete  Culture, blood (Routine X 2) w Reflex to ID Panel     Status: None (Preliminary result)   Collection Time: 05/24/17  3:30 PM  Result Value Ref Range Status   Specimen Description BLOOD LEFT HAND  Final   Special Requests   Final    BOTTLES DRAWN AEROBIC AND ANAEROBIC Blood Culture adequate volume   Culture  Setup Time   Final    GRAM POSITIVE COCCOBACILLUS ANAEROBIC BOTTLE ONLY CRITICAL RESULT CALLED TO, READ BACK BY AND VERIFIED WITH: M TURNER 05/28/17 @ 0857 M VESTAL Organism ID to follow    Culture CULTURE REINCUBATED FOR BETTER GROWTH  Final   Report Status PENDING  Incomplete      Radiology Studies: No results found.   Scheduled Meds: . amoxicillin-clavulanate  1 tablet Oral Q12H  . ibuprofen  600 mg Oral QID  . insulin aspart  0-9 Units Subcutaneous TID WC  . insulin glargine  15 Units Subcutaneous Daily  . nicotine  21 mg Transdermal Daily  . oxyCODONE-acetaminophen  2 tablet Oral  Q4H  . polyethylene glycol  17 g Oral Daily  . senna-docusate  1 tablet Oral BID   Continuous Infusions:    LOS: 5 days   Time Spent in minutes   30 minutes  Teleah Villamar, JAI-GURMUKH D.O. on 05/29/2017 at 9:31 AM  Between 7am to 7pm - Pager - [phone]  After 7pm go to [email or web address] - password [email or web address]  And look for the night coverage person covering for me after hours  [facility] Office  [phone]

## 2017-05-29 NOTE — Progress Notes (Addendum)
[facility] Will update note with response when available.  17:40 [facility] orders will need to include CSW.

## 2017-05-30 ENCOUNTER — Encounter: Payer: Self-pay | Admitting: Family Medicine

## 2017-05-30 LAB — CBC WITH DIFFERENTIAL/PLATELET
BASOS PCT: 0 %
Basophils Absolute: 0 10*3/uL (ref 0.0–0.1)
Eosinophils Absolute: 0.3 10*3/uL (ref 0.0–0.7)
Eosinophils Relative: 3 %
HEMATOCRIT: 36.8 % — AB (ref 39.0–52.0)
HEMOGLOBIN: 11.8 g/dL — AB (ref 13.0–17.0)
LYMPHS PCT: 29 %
Lymphs Abs: 2.4 10*3/uL (ref 0.7–4.0)
MCH: 28.3 pg (ref 26.0–34.0)
MCHC: 32.1 g/dL (ref 30.0–36.0)
MCV: 88.2 fL (ref 78.0–100.0)
Monocytes Absolute: 0.8 10*3/uL (ref 0.1–1.0)
Monocytes Relative: 10 %
NEUTROS ABS: 4.6 10*3/uL (ref 1.7–7.7)
Neutrophils Relative %: 58 %
Platelets: 315 10*3/uL (ref 150–400)
RBC: 4.17 MIL/uL — ABNORMAL LOW (ref 4.22–5.81)
RDW: 13 % (ref 11.5–15.5)
WBC: 8.1 10*3/uL (ref 4.0–10.5)

## 2017-05-30 LAB — BASIC METABOLIC PANEL
ANION GAP: 7 (ref 5–15)
BUN: 12 mg/dL (ref 6–20)
CO2: 29 mmol/L (ref 22–32)
Calcium: 8.7 mg/dL — ABNORMAL LOW (ref 8.9–10.3)
Chloride: 100 mmol/L — ABNORMAL LOW (ref 101–111)
Creatinine, Ser: 0.68 mg/dL (ref 0.61–1.24)
GFR calc Af Amer: >60 mL/min (ref >60)
GLUCOSE: 292 mg/dL — AB (ref 65–99)
Potassium: 4.5 mmol/L (ref 3.5–5.1)
SODIUM: 136 mmol/L (ref 135–145)

## 2017-05-30 LAB — GLUCOSE, CAPILLARY
GLUCOSE-CAPILLARY: 288 mg/dL — AB (ref 65–99)
Glucose-Capillary: 217 mg/dL — ABNORMAL HIGH (ref 65–99)
Glucose-Capillary: 241 mg/dL — ABNORMAL HIGH (ref 65–99)
Glucose-Capillary: 204 mg/dL — ABNORMAL HIGH (ref 65–99)

## 2017-05-30 LAB — CULTURE, BLOOD (ROUTINE X 2): SPECIAL REQUESTS: ADEQUATE

## 2017-05-30 MED ORDER — OXYCODONE-ACETAMINOPHEN 5-325 MG PO TABS: 1.0000 | ORAL_TABLET | ORAL | 0 refills | Status: AC

## 2017-05-30 MED ORDER — IBUPROFEN 200 MG PO TABS: 800.0000 mg | ORAL_TABLET | Freq: Three times a day (TID) | ORAL | 0 refills | Status: AC

## 2017-05-30 MED ORDER — AMOXICILLIN-POT CLAVULANATE 875-125 MG PO TABS
1.0000 | ORAL_TABLET | Freq: Two times a day (BID) | ORAL | 0 refills | Status: AC
Start: 2017-05-30 — End: 2017-07-04

## 2017-05-30 MED ORDER — METFORMIN HCL 500 MG PO TABS: ORAL_TABLET | ORAL | 3 refills | Status: AC

## 2017-05-30 NOTE — Progress Notes (Signed)
CM confirmed with MD pt will not d/c with insulin Rx, discharging pt with glucophage. CM requested Rx for glucometer kit and supplies, after learning pt doesn't have a functional glucometer @ home from MD. CN made aware and will f/u with obtaining Rx for pt. Whitman Hero RN, BSN,CM

## 2017-05-30 NOTE — Progress Notes (Signed)
I spoke with Mr. Dan [name] concerning his discharge today. He is having problems at him home and is unable to leave the hospital and have a safe place to stay tonight. I spoke with his girlfriend about the issues at their home and she is going to make sure there is not a problem in the morning so [name] can be discharged. Everyone is in agreement that he will stay the night in the hospital and then leave in the morning.

## 2017-05-30 NOTE — Progress Notes (Signed)
CM spoke with pt @ bedside regarding discharge Rxs. CM offered GoodRx coupon card to assist with cost, however, pt stated "I cant afford the coupon cost". CM then explained and offered Match Letter to patient and pt was very appreciative to receive Match Letter to assist with medication needs. Gae GallopAngela Kendarius Vigen RN,BSN,CM

## 2017-05-30 NOTE — Discharge Summary (Signed)
Physician Discharge Summary  Elpidio GaleaJohn W Maragh WUJ:[id number] DOB: 24-Nov-1970 DOA: 05/23/2017  PCP: Patient, No Pcp Per  Admit date: 05/23/2017 Discharge date: 05/30/2017  Time spent: 45 minutes  Recommendations for Outpatient Follow-up:  1. patient will need a primary care physician on discharge and will need to have his blood sugar checked regularly 2. Patient is noted to be noncompliant-has been told in the past use metformin which we have reinitiated and given him a prescription for the same 3. He will need Augmentin until June 24 2017 to complete 6 weeks for course of discitis care--he does not require any further imaging but should complete the same 4. home health will be ordered for the patient  Discharge Diagnoses:  Principal Problem:   Discitis of lumbosacral region Active Problems:   Diabetes mellitus without complication (HCC)   IVDU (intravenous drug user)   Back pain   Methamphetamine abuse   Tobacco abuse   Bacteremia due to Gram-negative bacteria   Discharge Condition: improved but guarded as patient may demonstrate noncompliance  Diet recommendation: diabetic  Filed Weights   05/24/17 0645  Weight: 86.2 kg (190 lb 0.6 oz)    History of present illness:  46 y.o.malewith medical history significant of diabetes mellitus, IV drug use, methamphetamine abuse, tobacco abuse, medication noncompliance, who presents with lower back pain and a urinary incontinence.  Patient states that he has been having lower back pain for about 10 days, which has been progressively getting worse. His back pain is constant, 10 out of 10 in severity, sharp, radiating to left leg posteriorly. He has intermittent urinary incontinence. He denies weakness in the legs. No fever or chills. Patient does not have chest pain, shortness breath, cough, nausea, vomiting, diarrhea, abdominal pain, symptoms of UTI. No headache, photophobia, neck rigidity. [facility] and he  refused to have an MRI performed. He ended up having positive blood cultures for Grewgram-negative rods, and was called back today.   Hospital Course:  Discitis of the lumbosacral region/bacteremia/possible osteomyelitis/bacteremia -MRI concerning for discitis of the L5-S1 region -Currently hemodynamically stable -Blood cultures 1/902from 05/20/2017 showed gram variable rod -Blood culture 05/23/2017 detected enterococcus faecalis, Susceptibilities pending -Blood cultures from 05/24/2017 show no growth to date -Neurosurgery consultation appreciated, recommended IRfor disc aspiration, per interventional radiology, doesnot feel patient needs aspiration, continue antibiotic treatment -Echocardiogram EF 55-60%, no RWMA -Infectious disease consulted and appreciated -Continue pain control--changed to 2 tabs scheduled percocet q4, added ibu 600 and kpad, limited IV morphine dosing -was placed on vancomycin/cefepime- transitioned to vanc/flagyl as patient has PCN allergy-- ID recommendeds 6 weeks of antibiotics, no PICC--tolerating Augemtnin well--- stop date for antibiotics 07/04/2017 -PT consulted recommended home health PTwhich has been ordered  Diabetes mellitus, 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 -Hemoglobin A1c 11.5 05/20/2017 -Patient does not take any medications at home -Presented with blood sugar of 433 -Continueinsulin sliding scale, Lantus, CBG monitoring -will increase Lantus to 15u - added metformin 500 bid 9/12---?> Have given specific instructions in terms of increasing the metformin to thousand twice a day as I do not think he will be compliant with the same and has not been taking insulin in the outpatient setting despite having been told he has been been diabetic for over 5 years  Polysubstance abuse  -Toxicology screen positive for amphetamines (patient has no home medications) -Discussed cessation from all illicit drugs as well as tobacco use -Continue nicotine patch,  unlikely he will quit  Normocytic anemia -Hemoglobin currently 11.1, baseline approximately  12-14 -Continue to monitor CBC  Hypokalemia -Possibly secondary to insulin -Resolved, continue to monitor BMP    Discharge Exam: Vitals:   05/29/17 2050 05/30/17 0454  BP: 123/73 112/67  Pulse: 76 69  Resp: 18 17  Temp: 98.3 F (36.8 C) 98.2 F (36.8 C)  SpO2: 100% 100%   alert pleasant oriented no distress Able to move around walked with PT yesterday Many questions about what he can do and questions about insulin, taking his medications being able to afford them and application for disability  Discharge Instructions   Discharge Instructions    Diet - low sodium heart healthy    Complete by:  As directed    Discharge instructions    Complete by:  As directed    you will need to continue the Augmentin until over 18th 2018 to complete 6 weeks for a course of antibiotics for back infection We will give you a limited amount of control substances instead over-the-counter ibuprofen 800 mg 3 times a day with food will help you with severe pain I would recommend that you consider getting a primary care physician-given the expense and lack of EASE of use of insulin we have decided to give you high doses of metformin as per the instructions written for you should take it 500 mg twice a day for another 4 days and then increase the dose to thousand milligrams at night provider do with refills I have also provided you with a letter excusing U from work and he will need to contact primary care physician or any other healthcare provider for further excuse from work   Increase activity slowly    Complete by:  As directed      Current Discharge Medication List    START taking these medications   Details  amoxicillin-clavulanate (AUGMENTIN) 875-125 MG tablet Take 1 tablet by mouth every 12 (twelve) hours. Qty: 70 tablet, Refills: 0    ibuprofen (ADVIL,MOTRIN) 200 MG tablet Take 4 tablets (800  mg total) by mouth 3 (three) times daily. Qty: 45 tablet, Refills: 0    metFORMIN (GLUCOPHAGE) 500 MG tablet Start by taking 500 mg 2 xA  DAY FOR 4 DAYS AND THEN INCREASE TO 1000 mg a day twice a day Qty: 60 tablet, Refills: 3    oxyCODONE-acetaminophen (PERCOCET/ROXICET) 5-325 MG tablet Take 1 tablet by mouth every 4 (four) hours. Qty: 20 tablet, Refills: 0       Allergies  Allergen Reactions  . Bee Venom Anaphylaxis, Shortness Of Breath and Swelling  . Penicillins Hives    Has patient had a PCN reaction causing immediate rash, facial/tongue/throat swelling, SOB or lightheadedness with hypotension: Yes Has patient had a PCN reaction causing severe rash involving mucus membranes or skin necrosis: Unknown Has patient had a PCN reaction that required hospitalization: No (was already IN the hosp) Has patient had a PCN reaction occurring within the last 10 years: No If all of the above answers are "NO", then may proceed with Cephalosporin use.    Follow-up Information    [facility]. Go on 06/19/2017.   Specialty:  Internal Medicine Why:  Appointment at 09:30. Please bring your ID and any insurance cards you may have. Arrive 15 minutes early and if you cannot attend appointment please call and reschedule. Contact information: [address] [place] [address] [phone]       [facility] Follow up.   Why:  For home health, they will  contact you in the next 1-2 days to set up your first home visit Contact information: [address] [place] [address] [phone]            The results of significant diagnostics from this hospitalization (including imaging, microbiology, ancillary and laboratory) are listed below for reference.    Significant Diagnostic Studies: Dg Chest 2 View  Result Date: 05/23/2017 CLINICAL DATA:  Back pain.  Possible sepsis. EXAM: CHEST  2 VIEW COMPARISON:  May 20, 2017 FINDINGS:  The heart size and mediastinal contours are within normal limits. Both lungs are clear. The visualized skeletal structures are unremarkable. IMPRESSION: No active cardiopulmonary disease. Electronically Signed   By: Gerome Sam III M.D   On: 05/23/2017 19:35   Ct Head Wo Contrast  Result Date: 05/20/2017 CLINICAL DATA:  Patient found unresponsive and apneic at home earlier today, personal history of methamphetamine abuse, who became alert and combative after Narcan administration. EXAM: CT HEAD WITHOUT CONTRAST TECHNIQUE: Contiguous axial images were obtained from the base of the skull through the vertex without intravenous contrast. COMPARISON:  None. FINDINGS: Motion blurred several images initially but these were repeated and a diagnostic study was obtained. Brain: Ventricular system normal in size and appearance for age. No mass lesion. No midline shift. No acute hemorrhage or hematoma. No extra-axial fluid collections. No evidence of acute infarction. No focal brain parenchymal abnormalities. Vascular: No hyperdense vessel. No visible atherosclerosis. Skull: No skull fracture or other focal osseous abnormality involving the skull. Sinuses/Orbits: Mucosal thickening involving the frontal sinuses and scattered bilateral ethmoid air cells. Mucous retention cyst or polyp in the right maxillary sinus. Left maxillary sinuses, bilateral sphenoid sinuses, bilateral mastoid air cells and bilateral middle ear cavities well-aerated. Orbits and globes intact. Other: None. IMPRESSION: 1. Normal intracranially. 2. 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 Electronically Signed   By: Hulan Saas M.D.   On: 05/20/2017 13:09   Ct Lumbar Spine Wo Contrast  Result Date: 05/22/2017 CLINICAL DATA:  46 y/o  M; severe back pain. EXAM: CT LUMBAR SPINE WITHOUT CONTRAST TECHNIQUE: Multidetector CT imaging of the lumbar spine was performed without intravenous contrast  administration. Multiplanar CT image reconstructions were also generated. COMPARISON:  None. FINDINGS: Segmentation: 5 lumbar type vertebrae. Alignment: Straightening of lumbar lordosis without listhesis. Vertebrae: Chronic bilateral L5 pars defects. No acute fracture identified. Paraspinal and other soft tissues: Negative. Disc levels: Moderate loss of the L5-S1 intervertebral disc space with sclerosis and eburnation of the opposing endplates compatible with erosive osteoarthritis. Mild loss of the L3-4 intervertebral disc space. No significant bony foraminal or canal stenosis. Moderate disc bulge at the L5-S1 level. IMPRESSION: 1. No acute fracture or dislocation. 2. Straightening of lumbar lordosis.  No listhesis. 3. Chronic L5 pars defects. 4. Mild L3-4 and moderate L5-S1 loss of intervertebral disc space height. L5-S1 endplate sclerosis and eburnation compatible with sequelae of erosive osteoarthritis. Moderate L5-S1 disc bulge. Electronically Signed   By: Mitzi Hansen M.D.   On: 05/22/2017 02:49   Mr Thoracic Spine W Wo Contrast  Result Date: 05/24/2017 CLINICAL DATA:  46 y/o M; history of IV drug abuse presenting with lower extremity weakness and difficulty ambulating due to pain. Patient endorses some urinary incontinence. EXAM: MRI THORACIC AND LUMBAR SPINE WITHOUT AND WITH CONTRAST TECHNIQUE: Multiplanar and multiecho pulse sequences of the thoracic and lumbar spine were obtained without and with intravenous contrast. CONTRAST:  18mL MULTIHANCE GADOBENATE DIMEGLUMINE 529 MG/ML IV SOLN COMPARISON:  05/22/2017 CT lumbar spine. FINDINGS: MRI THORACIC SPINE FINDINGS Alignment:  Physiologic. Vertebrae: No evidence of fracture for discitis. No epidural collection. Multiple hemangioma the largest in the T5 vertebral body. Cord:  Normal signal and morphology.  No abnormal enhancement. Paraspinal and other soft tissues: Negative. Disc levels: T3-4 small right subarticular disc protrusion contacts the  right anterior cord without significant canal or foraminal stenosis. Otherwise no significant disc displacement, foraminal stenosis, or canal stenosis. Prominent ligamentum flavum hypertrophy at the T10-11 level. MRI LUMBAR SPINE FINDINGS Segmentation:  Standard. Alignment:  Physiologic. Vertebrae: Low signal within the opposing endplates at the L5-S1 level without significant edema or enhancement. This corresponds sclerosis on CT. There is enhancement and edema within the adjacent mid vertebral bodies (series 18, image 7) and there is a small fluid collection with marginal enhancement within the anterior disc space the (series 15, image 10 and series 18, image 10). Conus medullaris: Extends to the L1 level and appears normal. No abnormal enhancement. No epidural collection. Paraspinal and other soft tissues: Mild edema and enhancement within prevertebral soft tissues at the L5 and S1 levels. No paravertebral fluid collection. Disc levels: L1-2 through L4-5: No significant disc displacement, foraminal stenosis, or canal stenosis. L5-S1: Disc bulge and marginal osteophytes eccentric to the left. Mild right and moderate left foraminal stenosis. No significant canal stenosis. IMPRESSION: 1. Mild enhancement within the L5 and S1 vertebral bodies and small marginally enhancing fluid collection within the anterior disc space likely represents acute discitis or erosive osteoarthritis. Acute inflammation is superimposed on extensive chronic inflammatory changes which correspond to sclerosis and eburnation on prior CT. 2. Mild prevertebral edema at L5-S1 is likely reactive. No paravertebral or epidural fluid collection identified. 3. Moderate disc bulge at L5-S1 eccentric to the left with moderate left and mild right foraminal stenosis. 4. Otherwise no significant foraminal or canal stenosis of the thoracic and lumbar spine. Electronically Signed   By: Mitzi Hansen M.D.   On: 05/24/2017 03:46   Mr Lumbar Spine  W Wo Contrast  Result Date: 05/24/2017 CLINICAL DATA:  46 y/o M; history of IV drug abuse presenting with lower extremity weakness and difficulty ambulating due to pain. Patient endorses some urinary incontinence. EXAM: MRI THORACIC AND LUMBAR SPINE WITHOUT AND WITH CONTRAST TECHNIQUE: Multiplanar and multiecho pulse sequences of the thoracic and lumbar spine were obtained without and with intravenous contrast. CONTRAST:  18mL MULTIHANCE GADOBENATE DIMEGLUMINE 529 MG/ML IV SOLN COMPARISON:  05/22/2017 CT lumbar spine. FINDINGS: MRI THORACIC SPINE FINDINGS Alignment:  Physiologic. Vertebrae: No evidence of fracture for discitis. No epidural collection. Multiple hemangioma the largest in the T5 vertebral body. Cord:  Normal signal and morphology.  No abnormal enhancement. Paraspinal and other soft tissues: Negative. Disc levels: T3-4 small right subarticular disc protrusion contacts the right anterior cord without significant canal or foraminal stenosis. Otherwise no significant disc displacement, foraminal stenosis, or canal stenosis. Prominent ligamentum flavum hypertrophy at the T10-11 level. MRI LUMBAR SPINE FINDINGS Segmentation:  Standard. Alignment:  Physiologic. Vertebrae: Low signal within the opposing endplates at the L5-S1 level without significant edema or enhancement. This corresponds sclerosis on CT. There is enhancement and edema within the adjacent mid vertebral bodies (series 18, image 7) and there is a small fluid collection with marginal enhancement within the anterior disc space the (series 15, image 10 and series 18, image 10). Conus medullaris: Extends to the L1 level and appears normal. No abnormal enhancement. No epidural collection. Paraspinal and other soft tissues: Mild edema and enhancement  within prevertebral soft tissues at the L5 and S1 levels. No paravertebral fluid collection. Disc levels: L1-2 through L4-5: No significant disc displacement, foraminal stenosis, or canal stenosis.  L5-S1: Disc bulge and marginal osteophytes eccentric to the left. Mild right and moderate left foraminal stenosis. No significant canal stenosis. IMPRESSION: 1. Mild enhancement within the L5 and S1 vertebral bodies and small marginally enhancing fluid collection within the anterior disc space likely represents acute discitis or erosive osteoarthritis. Acute inflammation is superimposed on extensive chronic inflammatory changes which correspond to sclerosis and eburnation on prior CT. 2. Mild prevertebral edema at L5-S1 is likely reactive. No paravertebral or epidural fluid collection identified. 3. Moderate disc bulge at L5-S1 eccentric to the left with moderate left and mild right foraminal stenosis. 4. Otherwise no significant foraminal or canal stenosis of the thoracic and lumbar spine. Electronically Signed   By: Mitzi Hansen M.D.   On: 05/24/2017 03:46   Dg Chest Port 1 View  Result Date: 05/20/2017 CLINICAL DATA:  Altered mental status.  Chest pain. EXAM: PORTABLE CHEST 1 VIEW COMPARISON:  12/22/2015. FINDINGS: Normal sized heart. Clear lungs. Mild levoconvex thoracic scoliosis. IMPRESSION: No acute abnormality. Electronically Signed   By: Beckie Salts M.D.   On: 05/20/2017 12:29    Microbiology: Recent Results (from the past 240 hour(s))  Culture, blood (Routine X 2) w Reflex to ID Panel     Status: Abnormal   Collection Time: 05/20/17  2:05 PM  Result Value Ref Range Status   Specimen Description BLOOD RIGHT HAND  Final   Special Requests   Final    BOTTLES DRAWN AEROBIC AND ANAEROBIC Blood Culture adequate volume   Culture  Setup Time (A)  Final    GRAM VARIABLE ROD ANAEROBIC BOTTLE ONLY CRITICAL RESULT CALLED TO, READ BACK BY AND VERIFIED WITH: S COBLE RN [id number] 05/22/17 A BROWNING    Culture (A)  Final    ANAEROBIC GRAM POSITIVE RODS UNABLE TO FURTHER IDENTIFY    Report Status 05/25/2017 FINAL  Final  Blood Culture ID Panel (Reflexed)     Status: None   Collection Time:  05/20/17  2:05 PM  Result Value Ref Range Status   Enterococcus species NOT DETECTED NOT DETECTED Final   Vancomycin resistance NOT DETECTED NOT DETECTED Final   Listeria monocytogenes NOT DETECTED NOT DETECTED Final   Staphylococcus species NOT DETECTED NOT DETECTED Final   Staphylococcus aureus NOT DETECTED NOT DETECTED Final   Methicillin resistance NOT DETECTED NOT DETECTED Final   Streptococcus species NOT DETECTED NOT DETECTED Final   Streptococcus agalactiae NOT DETECTED NOT DETECTED Final   Streptococcus pneumoniae NOT DETECTED NOT DETECTED Final   Streptococcus pyogenes NOT DETECTED NOT DETECTED Final   Acinetobacter baumannii NOT DETECTED NOT DETECTED Final   Enterobacteriaceae species NOT DETECTED NOT DETECTED Final   Enterobacter cloacae complex NOT DETECTED NOT DETECTED Final   Escherichia coli NOT DETECTED NOT DETECTED Final   Klebsiella oxytoca NOT DETECTED NOT DETECTED Final   Klebsiella pneumoniae NOT DETECTED NOT DETECTED Final   Proteus species NOT DETECTED NOT DETECTED Final   Serratia marcescens NOT DETECTED NOT DETECTED Final   Carbapenem resistance NOT DETECTED NOT DETECTED Final   Haemophilus influenzae NOT DETECTED NOT DETECTED Final   Neisseria meningitidis NOT DETECTED NOT DETECTED Final   Pseudomonas aeruginosa NOT DETECTED NOT DETECTED Final   Candida albicans NOT DETECTED NOT DETECTED Final   Candida glabrata NOT DETECTED NOT DETECTED Final   Candida krusei NOT DETECTED NOT DETECTED Final  Candida parapsilosis NOT DETECTED NOT DETECTED Final   Candida tropicalis NOT DETECTED NOT DETECTED Final  Culture, blood (Routine X 2) w Reflex to ID Panel     Status: Abnormal   Collection Time: 05/20/17  2:10 PM  Result Value Ref Range Status   Specimen Description BLOOD RIGHT ANTECUBITAL  Final   Special Requests   Final    BOTTLES DRAWN AEROBIC AND ANAEROBIC Blood Culture adequate volume   Culture  Setup Time   Final    GRAM NEGATIVE RODS ANAEROBIC BOTTLE  ONLY CRITICAL VALUE NOTED.  VALUE IS CONSISTENT WITH PREVIOUSLY REPORTED AND CALLED VALUE.    Culture CLOSTRIDIUM CLOSTRIDIOFORME (A)  Final   Report Status 05/28/2017 FINAL  Final  MRSA PCR Screening     Status: None   Collection Time: 05/20/17  6:27 PM  Result Value Ref Range Status   MRSA by PCR NEGATIVE NEGATIVE Final    Comment:        The GeneXpert MRSA Assay (FDA approved for NASAL specimens only), is one component of a comprehensive MRSA colonization surveillance program. It is not intended to diagnose MRSA infection nor to guide or monitor treatment for MRSA infections.   Culture, blood (Routine x 2)     Status: Abnormal   Collection Time: 05/23/17  6:59 PM  Result Value Ref Range Status   Specimen Description BLOOD RIGHT ANTECUBITAL  Final   Special Requests   Final    BOTTLES DRAWN AEROBIC AND ANAEROBIC Blood Culture adequate volume   Culture  Setup Time   Final    GRAM POSITIVE COCCI IN CHAINS IN BOTH AEROBIC AND ANAEROBIC BOTTLES CRITICAL VALUE NOTED.  VALUE IS CONSISTENT WITH PREVIOUSLY REPORTED AND CALLED VALUE.    Culture (A)  Final    ENTEROCOCCUS FAECALIS SUSCEPTIBILITIES PERFORMED ON PREVIOUS CULTURE WITHIN THE LAST 5 DAYS.    Report Status 05/26/2017 FINAL  Final  Culture, blood (Routine x 2)     Status: Abnormal   Collection Time: 05/23/17  7:05 PM  Result Value Ref Range Status   Specimen Description BLOOD LEFT ANTECUBITAL  Final   Special Requests   Final    BOTTLES DRAWN AEROBIC AND ANAEROBIC Blood Culture adequate volume   Culture  Setup Time   Final    GRAM POSITIVE COCCI IN CHAINS IN BOTH AEROBIC AND ANAEROBIC BOTTLES CRITICAL RESULT CALLED TO, READ BACK BY AND VERIFIED WITH: Corliss Parish [id number] MLM    Culture ENTEROCOCCUS FAECALIS (A)  Final   Report Status 05/26/2017 FINAL  Final   Organism ID, Bacteria ENTEROCOCCUS FAECALIS  Final      Susceptibility   Enterococcus faecalis - MIC*    AMPICILLIN <=2 SENSITIVE Sensitive      VANCOMYCIN 1 SENSITIVE Sensitive     GENTAMICIN SYNERGY SENSITIVE Sensitive     * ENTEROCOCCUS FAECALIS  Blood Culture ID Panel (Reflexed)     Status: Abnormal   Collection Time: 05/23/17  7:05 PM  Result Value Ref Range Status   Enterococcus species DETECTED (A) NOT DETECTED Final    Comment: CRITICAL RESULT CALLED TO, READ BACK BY AND VERIFIED WITH: PHARMD N BATCHELDER [id number] MLM    Vancomycin resistance NOT DETECTED NOT DETECTED Final   Listeria monocytogenes NOT DETECTED NOT DETECTED Final   Staphylococcus species NOT DETECTED NOT DETECTED Final   Staphylococcus aureus NOT DETECTED NOT DETECTED Final   Methicillin resistance NOT DETECTED NOT DETECTED Final   Streptococcus species NOT DETECTED NOT DETECTED Final  Streptococcus agalactiae NOT DETECTED NOT DETECTED Final   Streptococcus pneumoniae NOT DETECTED NOT DETECTED Final   Streptococcus pyogenes NOT DETECTED NOT DETECTED Final   Acinetobacter baumannii NOT DETECTED NOT DETECTED Final   Enterobacteriaceae species NOT DETECTED NOT DETECTED Final   Enterobacter cloacae complex NOT DETECTED NOT DETECTED Final   Escherichia coli NOT DETECTED NOT DETECTED Final   Klebsiella oxytoca NOT DETECTED NOT DETECTED Final   Klebsiella pneumoniae NOT DETECTED NOT DETECTED Final   Proteus species NOT DETECTED NOT DETECTED Final   Serratia marcescens NOT DETECTED NOT DETECTED Final   Carbapenem resistance NOT DETECTED NOT DETECTED Final   Haemophilus influenzae NOT DETECTED NOT DETECTED Final   Neisseria meningitidis NOT DETECTED NOT DETECTED Final   Pseudomonas aeruginosa NOT DETECTED NOT DETECTED Final   Candida albicans NOT DETECTED NOT DETECTED Final   Candida glabrata NOT DETECTED NOT DETECTED Final   Candida krusei NOT DETECTED NOT DETECTED Final   Candida parapsilosis NOT DETECTED NOT DETECTED Final   Candida tropicalis NOT DETECTED NOT DETECTED Final  Culture, blood (Routine X 2) w Reflex to ID Panel     Status: None    Collection Time: 05/24/17  3:20 PM  Result Value Ref Range Status   Specimen Description BLOOD RIGHT HAND  Final   Special Requests   Final    BOTTLES DRAWN AEROBIC AND ANAEROBIC Blood Culture adequate volume   Culture NO GROWTH 5 DAYS  Final   Report Status 05/29/2017 FINAL  Final  Culture, blood (Routine X 2) w Reflex to ID Panel     Status: Abnormal   Collection Time: 05/24/17  3:30 PM  Result Value Ref Range Status   Specimen Description BLOOD LEFT HAND  Final   Special Requests   Final    BOTTLES DRAWN AEROBIC AND ANAEROBIC Blood Culture adequate volume   Culture  Setup Time   Final    GRAM POSITIVE COCCOBACILLUS ANAEROBIC BOTTLE ONLY CRITICAL RESULT CALLED TO, READ BACK BY AND VERIFIED WITH: M TURNER 05/28/17 @ 0857 M VESTAL    Culture (A)  Final    ENTEROCOCCUS FAECALIS SUSCEPTIBILITIES PERFORMED ON PREVIOUS CULTURE WITHIN THE LAST 5 DAYS.    Report Status 05/30/2017 FINAL  Final     Labs: Basic Metabolic Panel:  Recent Labs Lab 05/26/17 0346 05/27/17 0230 05/28/17 0602 05/29/17 0419 05/30/17 0530  NA 136 137 135 136 136  K 3.4* 3.6 3.9 4.2 4.5  CL 99* 99* 98* 100* 100*  CO2 28 30 30 29 29   GLUCOSE 283* 188* 236* 217* 292*  BUN 8 7 7 6 12   CREATININE 0.61 0.53* 0.54* 0.63 0.68  CALCIUM 8.6* 8.6* 8.5* 8.7* 8.7*   Liver Function Tests:  Recent Labs Lab 05/23/17 1859  AST 20  ALT 15*  ALKPHOS 65  BILITOT 0.8  PROT 6.4*  ALBUMIN 3.1*   No results for input(s): LIPASE, AMYLASE in the last 168 hours. No results for input(s): AMMONIA in the last 168 hours. CBC:  Recent Labs Lab 05/23/17 1859 05/24/17 0512 05/26/17 0346 05/28/17 0602 05/29/17 0419 05/30/17 0530  WBC 11.3* 10.1 7.6 8.9 7.8 8.1  NEUTROABS 9.6*  --   --   --   --  4.6  HGB 12.6* 11.9* 11.2* 11.1* 11.8* 11.8*  HCT 37.7* 36.1* 34.5* 34.0* 36.5* 36.8*  MCV 86.5 87.4 86.9 87.2 88.0 88.2  PLT 141* 143* 165 212 244 315   Cardiac Enzymes: No results for input(s): CKTOTAL, CKMB,  CKMBINDEX, TROPONINI in the last  168 hours. BNP: BNP (last 3 results) No results for input(s): BNP in the last 8760 hours.  ProBNP (last 3 results) No results for input(s): PROBNP in the last 8760 hours.  CBG:  Recent Labs Lab 05/29/17 0624 05/29/17 1116 05/29/17 1658 05/29/17 2053 05/30/17 0625  GLUCAP 203* 251* 198* 275* 288*       Signed:  Rhetta Mura MD   [facility] 05/30/2017, 10:56 AM

## 2017-05-31 LAB — GLUCOSE, CAPILLARY
GLUCOSE-CAPILLARY: 294 mg/dL — AB (ref 65–99)
Glucose-Capillary: 224 mg/dL — ABNORMAL HIGH (ref 65–99)
Glucose-Capillary: 307 mg/dL — ABNORMAL HIGH (ref 65–99)

## 2017-05-31 NOTE — Progress Notes (Signed)
Notified [facility] that patient will need RW delivered to room prior to DC today

## 2017-05-31 NOTE — Progress Notes (Signed)
Physical Therapy Treatment Patient Details Name: Dan [name] MRN: [id number] DOB: 25-Apr-1971 Today's Date: 05/31/2017    History of Present Illness 46 y.o. male with medical history significant of diabetes mellitus, IV drug use, methamphetamine abuse, tobacco abuse, medication noncompliance, admitted with lower back pain, urinary incontinence- discitis    PT Comments    Pt performed stair training in prep for d/c home today.  Pt refused further gait training secondary to pain.  Plan for patient to d/c home today with support from his girlfriend.  Pt reports he is not discharging to his home but is staying somewhere else.  Will inform case management of this information.     Follow Up Recommendations  Home health PT     Equipment Recommendations  Rolling walker with 5" wheels;3in1 (PT)    Recommendations for Other Services OT consult     Precautions / Restrictions Precautions Precautions: Fall;Back Precaution Comments: back for pain management Restrictions Weight Bearing Restrictions: No    Mobility  Bed Mobility               General bed mobility comments: Pt sitting in recliner on arrival.    Transfers Overall transfer level: Needs assistance Equipment used: Rolling walker (2 wheeled) Transfers: Sit to/from Stand Sit to Stand: Supervision         General transfer comment: Cues for hand placement and eccentric loading.    Ambulation/Gait Ambulation/Gait assistance: Supervision Ambulation Distance (Feet): 10 Feet Assistive device: Rolling walker (2 wheeled) Gait Pattern/deviations: Step-through pattern;Decreased stride length;Trunk flexed   Gait velocity interpretation: Below normal speed for age/gender General Gait Details: Cues for upper trunk control and RW safety, remains with heavy reliance on RW for support.     Stairs Stairs: Yes   Stair Management: No rails;Forwards;With walker Number of Stairs: 2 General stair comments: Curb training x2  with RW.  Cues for sequencing and RW placement.  Supervision for safety.    Wheelchair Mobility    Modified Rankin (Stroke Patients Only)       Balance Overall balance assessment: Needs assistance   Sitting balance-Leahy Scale: Fair       Standing balance-Leahy Scale: Poor Standing balance comment: Heavy reliance on RW for standing and ambulation.                              Cognition Arousal/Alertness: Awake/alert Behavior During Therapy: WFL for tasks assessed/performed Overall Cognitive Status: Within Functional Limits for tasks assessed                                        Exercises      General Comments        Pertinent Vitals/Pain Pain Assessment: 0-10 Pain Score: 5  Pain Descriptors / Indicators: Throbbing Pain Intervention(s): Monitored during session;Repositioned    Home Living                      Prior Function            PT Goals (current goals can now be found in the care plan section) Acute Rehab PT Goals Patient Stated Goal: return to work Potential to Achieve Goals: Good Progress towards PT goals: Progressing toward goals    Frequency    Min 3X/week      PT Plan Current plan remains appropriate  Co-evaluation              AM-PAC PT "6 Clicks" Daily Activity  Outcome Measure  Difficulty turning over in bed (including adjusting bedclothes, sheets and blankets)?: Unable Difficulty moving from lying on back to sitting on the side of the bed? : Unable Difficulty sitting down on and standing up from a chair with arms (e.g., wheelchair, bedside commode, etc,.)?: A Little Help needed moving to and from a bed to chair (including a wheelchair)?: A Little Help needed walking in hospital room?: A Little Help needed climbing 3-5 steps with a railing? : A Little 6 Click Score: 14    End of Session Equipment Utilized During Treatment: Gait belt Activity Tolerance: Patient limited by  pain Patient left: in bed;with call bell/phone within reach Nurse Communication: Mobility status;Precautions PT Visit Diagnosis: Other abnormalities of gait and mobility (R26.89);Difficulty in walking, not elsewhere classified (R26.2)     Time: 0865-7846 PT Time Calculation (min) (ACUTE ONLY): 14 min  Charges:  $Gait Training: 8-22 mins                    G Codes:       Joycelyn Rua, PTA pager [phone]    Florestine Avers 05/31/2017, 11:15 AM

## 2017-05-31 NOTE — Progress Notes (Signed)
Spoke w Adrian at [facility]. Koleen Nimrody no longer give glucometers through the [facility] program. Patient's least expensive option will be [employer] Meter and test strips through [employer]. Patient not in room, explained this to Vernona Rieger RN bedside nurse to review with DC instructions. Papers left in room explaining glucometer pricing.

## 2017-06-04 LAB — CULTURE, BLOOD (ROUTINE X 2)
Culture: NO GROWTH
Culture: NO GROWTH
SPECIAL REQUESTS: ADEQUATE
SPECIAL REQUESTS: ADEQUATE

## 2017-06-04 NOTE — Discharge Summary (Signed)
   Patient Left AMA after my shift had completed. Please see Pts H&P from the same day for complete information regarding treatment plan. Apparently pt had + BCX which returned just after his leaving AMA. Pt has since been readmitted to the hospital.  Shelly Flatten, MD [facility] 06/04/2017, 1:12 PM

## 2017-06-19 ENCOUNTER — Ambulatory Visit: Payer: Self-pay | Admitting: Family Medicine

## 2018-07-06 IMAGING — CR DG CHEST 2V
2 series · 2 of 2 positions shown · non-contrast
Comparison: May 20, 2017

CLINICAL DATA: Back pain.  Possible sepsis.

EXAM:
CHEST  2 VIEW

[chest lat]
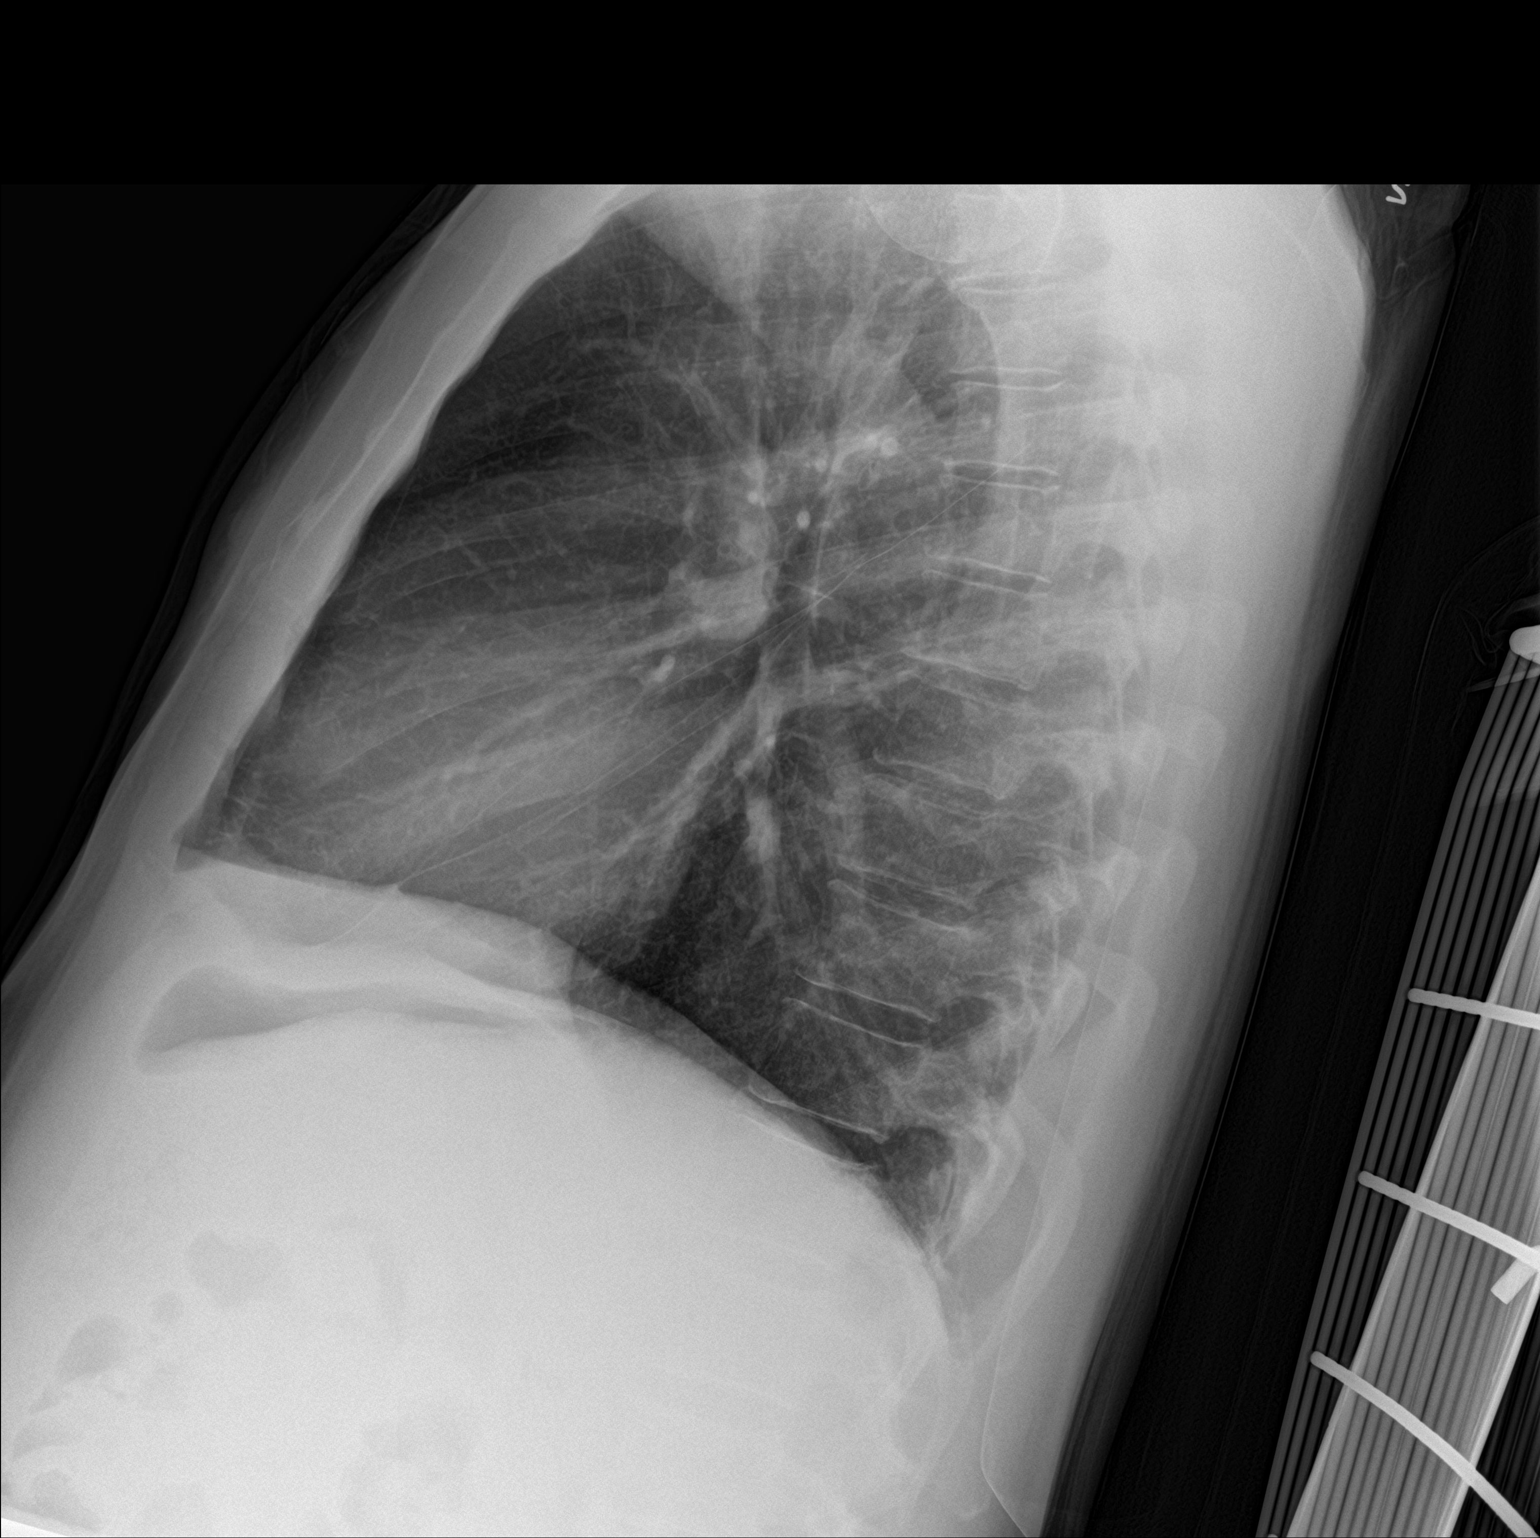

[chest ap]
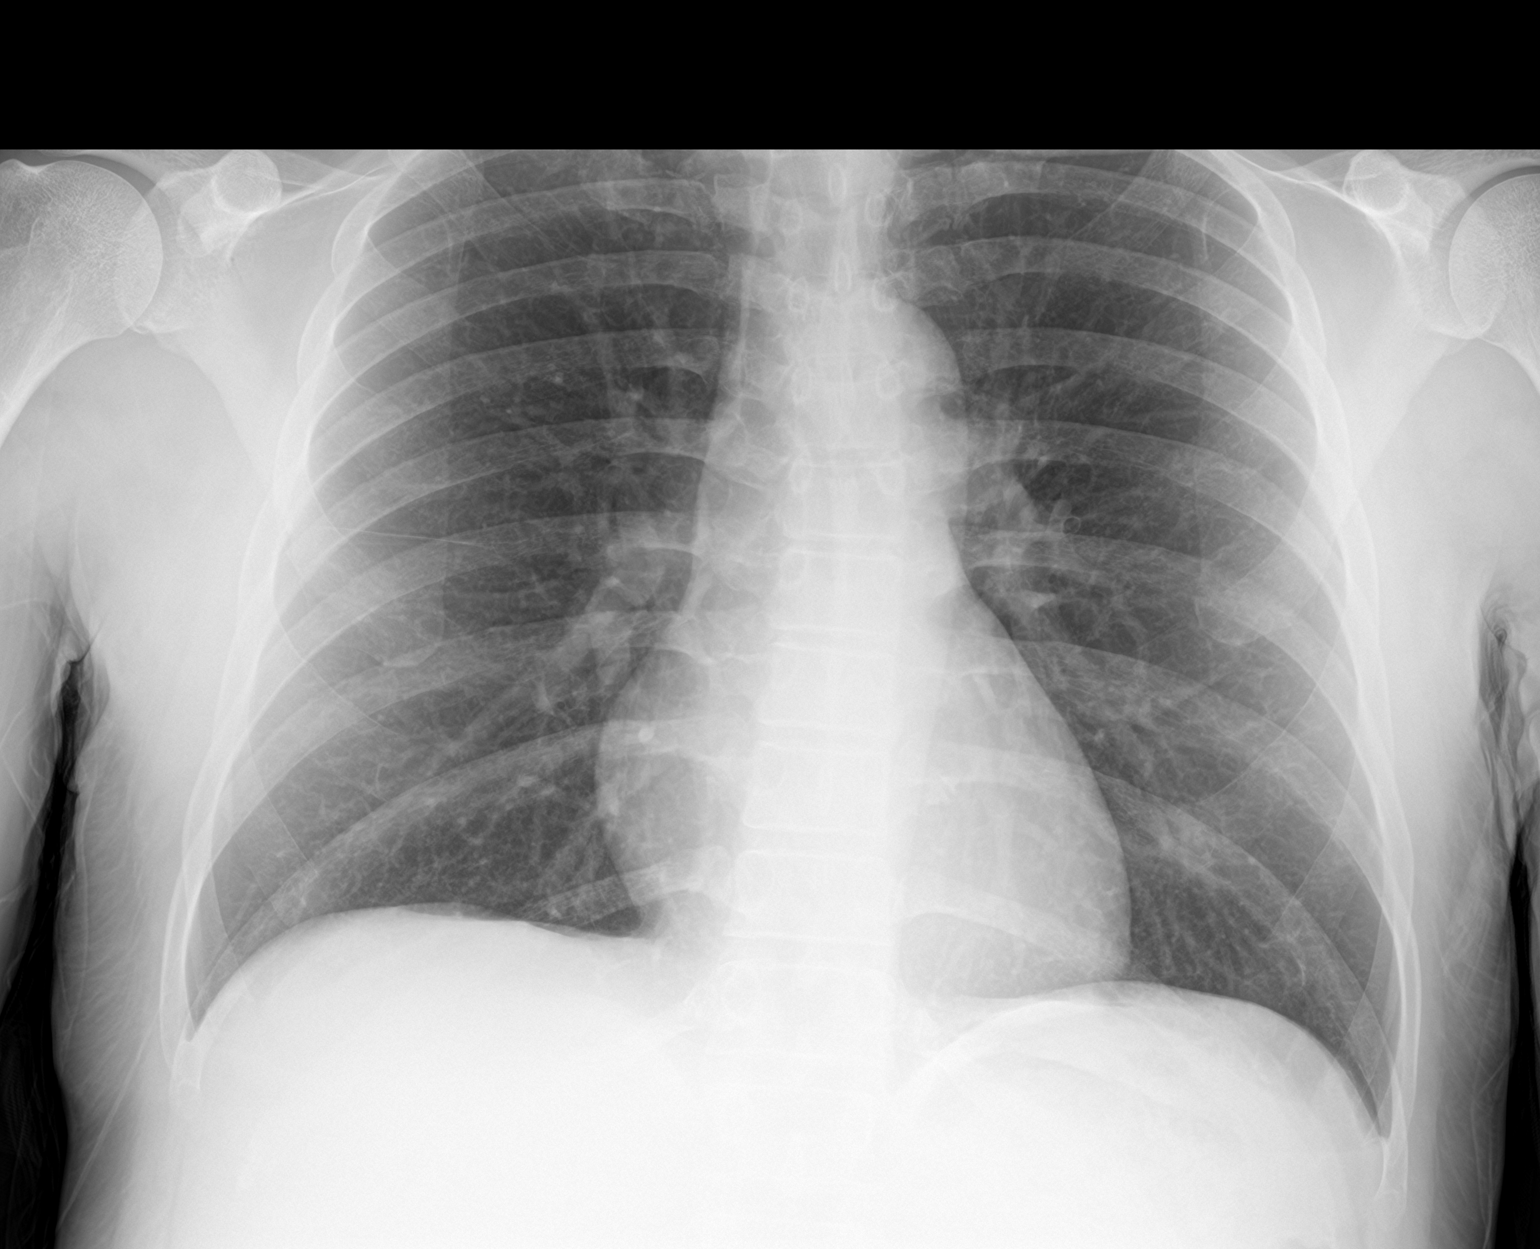

[2 of 2 positions shown; findings below may reference images not displayed]

FINDINGS: The heart size and mediastinal contours are within normal limits.
Both lungs are clear. The visualized skeletal structures are
unremarkable.
IMPRESSION: No active cardiopulmonary disease.

## 2018-07-07 IMAGING — MR MR THORACIC SPINE WO/W CM
6 of 16 series · 18 of 48 positions shown · IV contrast (20    MULTI)
Comparison: 05/22/2017 CT lumbar spine.

CLINICAL DATA: 45 y/o M; history of IV drug abuse presenting with
lower extremity weakness and difficulty ambulating due to pain.
Patient endorses some urinary incontinence.

EXAM:
MRI THORACIC AND LUMBAR SPINE WITHOUT AND WITH CONTRAST
TECHNIQUE: Multiplanar and multiecho pulse sequences of the thoracic and lumbar
spine were obtained without and with intravenous contrast.
CONTRAST:  18mL MULTIHANCE GADOBENATE DIMEGLUMINE 529 MG/ML IV SOLN

[Series 5: T2 · sagittal · 3.0mm · 0.62mm/px · 1 of 13 slices shown (1 of 4)]
[im 1/13]
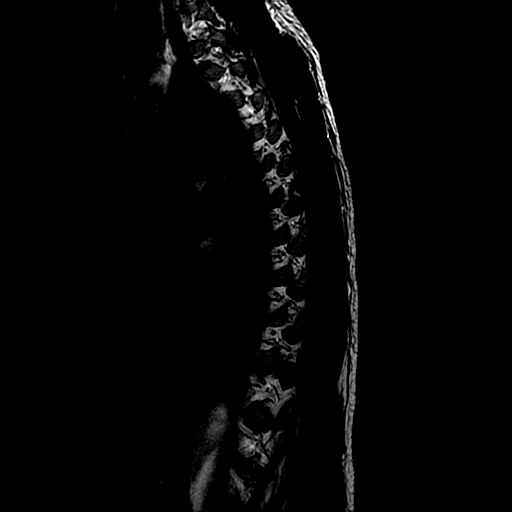

[Series 6: T1 · sagittal · 3.0mm · 0.62mm/px · 1 of 13 slices shown (1 of 2)]
[im 1/13]
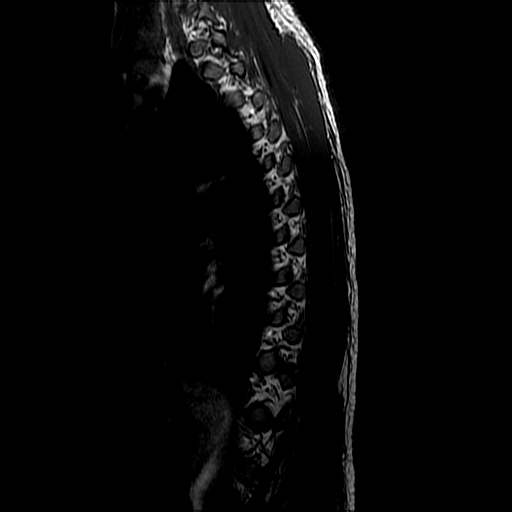

[Series 8: T2 · axial · 4.0mm · 0.43mm/px · z∈[-264,-9]mm · 5 of 41 slices shown (2 of 4)]
[im 1/41]
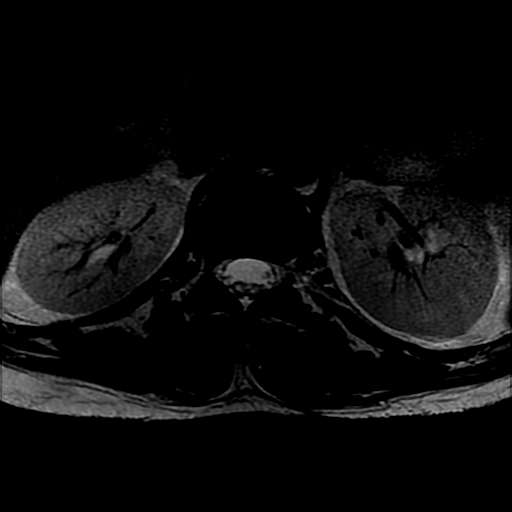
[im 11/41]
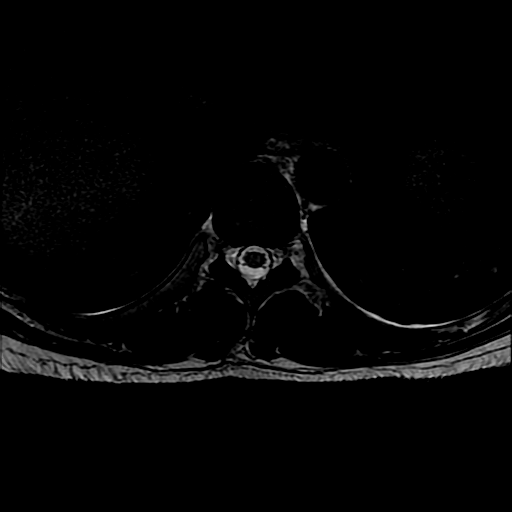
[im 21/41]
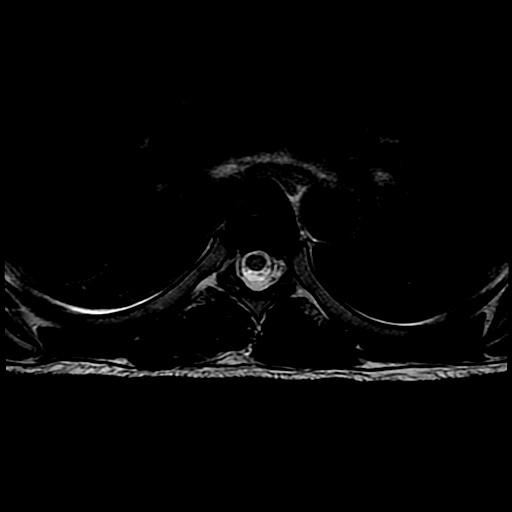
[im 31/41]
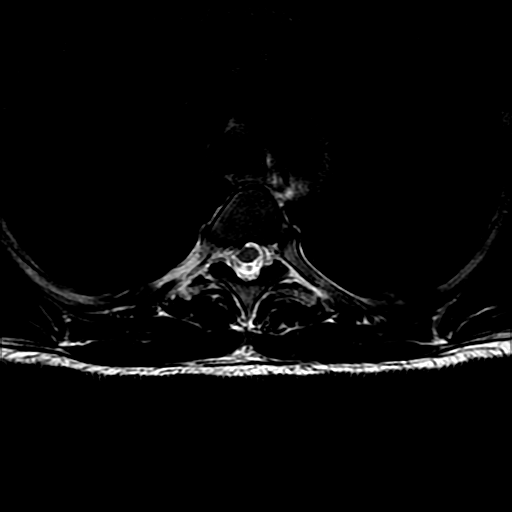
[im 41/41]
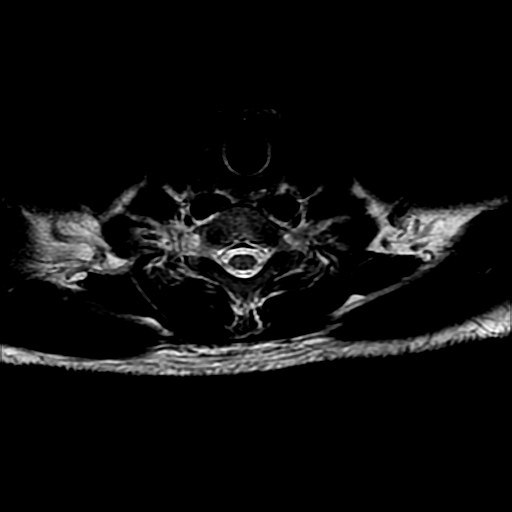

[Series 10: T1 · axial · non-contrast · 4.0mm · 0.43mm/px · z∈[-264,-9]mm · 5 of 41 slices shown (2 of 2)]
[im 1/41]
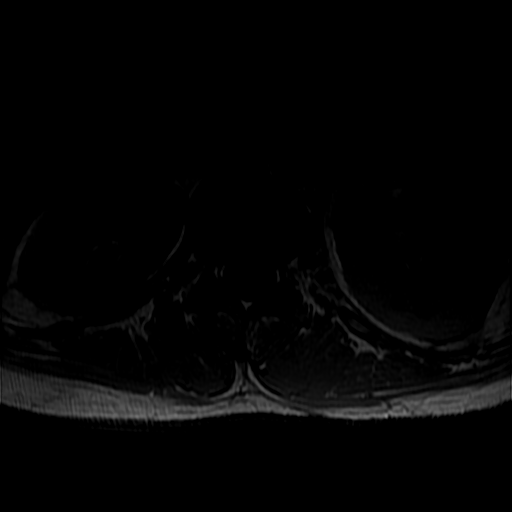
[im 11/41]
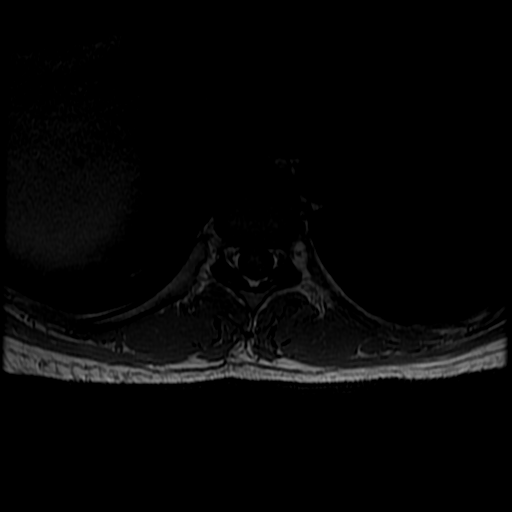
[im 21/41]
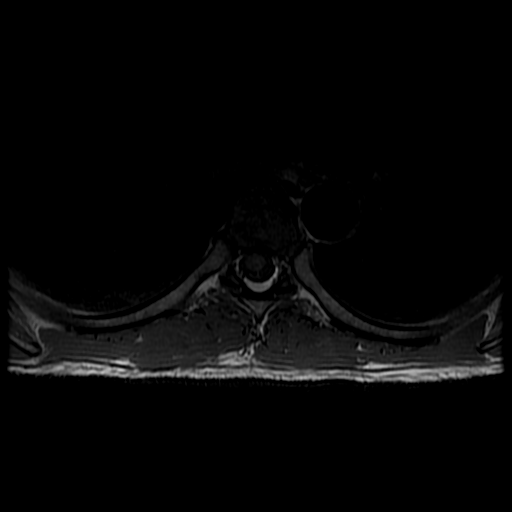
[im 31/41]
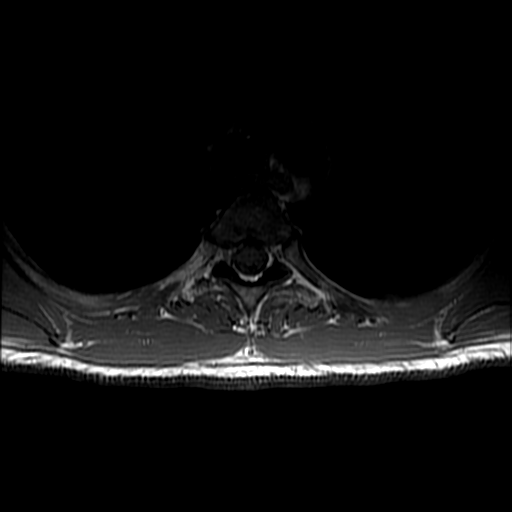
[im 41/41]
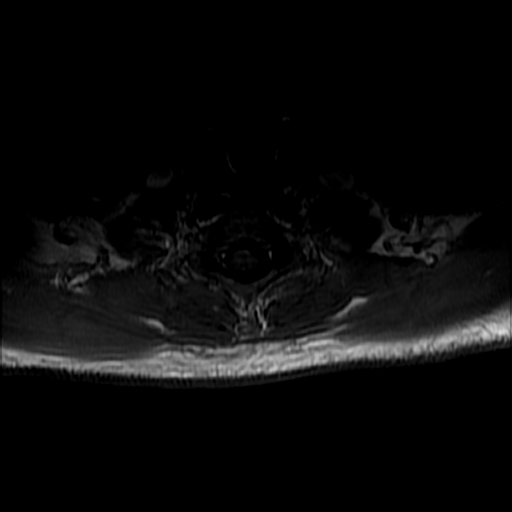

[Series 13: T2 · sagittal · 4.0mm · 0.55mm/px · 2 of 12 slices shown (3 of 4)]
[im 1/12]
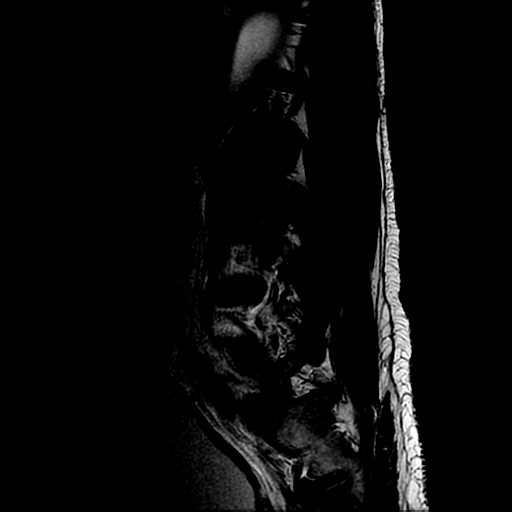
[im 12/12]
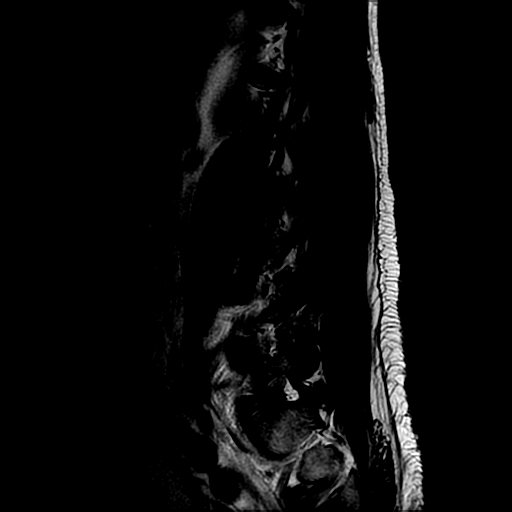

[Series 16: T2 · axial · 4.0mm · 0.39mm/px · z∈[-459,-292]mm · 4 of 32 slices shown (4 of 4)]
[im 1/32]
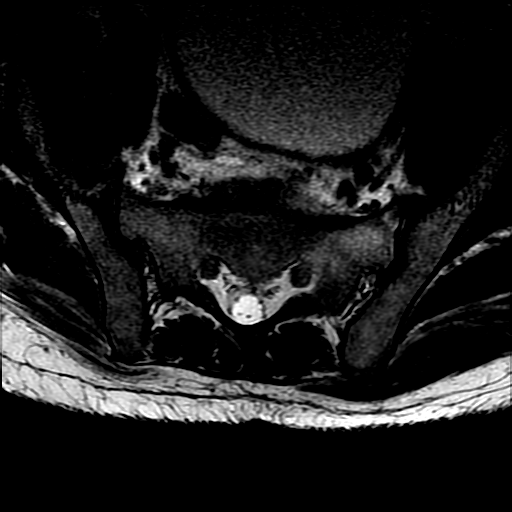
[im 11/32]
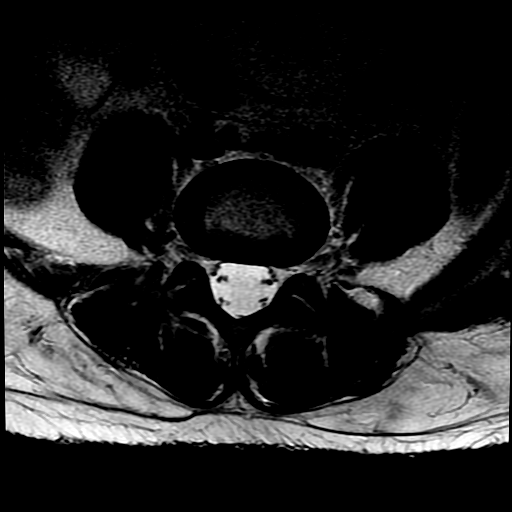
[im 21/32]
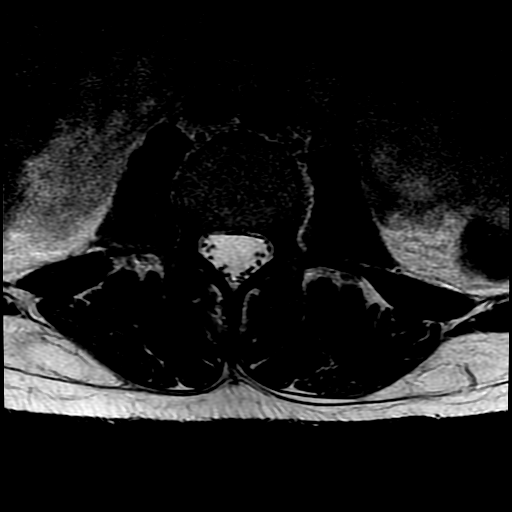
[im 32/32]
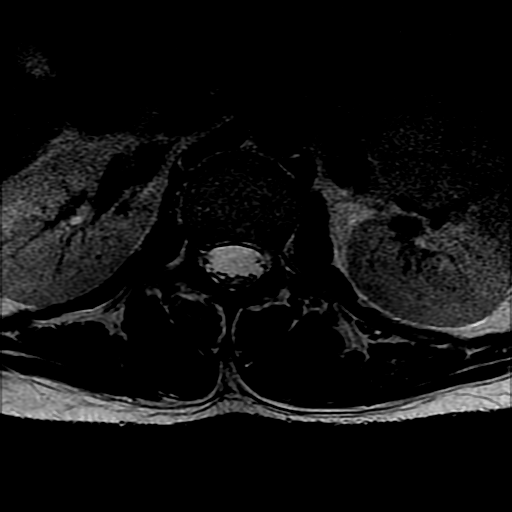

[18 of 48 positions shown; findings below may reference images not displayed]

FINDINGS: MRI THORACIC SPINE FINDINGS

Alignment:  Physiologic.

Vertebrae: No evidence of fracture for discitis. No epidural
collection. Multiple hemangioma the largest in the T5 vertebral
body.

Cord:  Normal signal and morphology.  No abnormal enhancement.

Paraspinal and other soft tissues: Negative.

Disc levels:

T3-4 small right subarticular disc protrusion contacts the right
anterior cord without significant canal or foraminal stenosis.
Otherwise no significant disc displacement, foraminal stenosis, or
canal stenosis. Prominent ligamentum flavum hypertrophy at the
T10-11 level.

MRI LUMBAR SPINE FINDINGS

Segmentation:  Standard.

Alignment:  Physiologic.

Vertebrae: Low signal within the opposing endplates at the L5-S1
level without significant edema or enhancement. This corresponds
sclerosis on CT. There is enhancement and edema within the adjacent
mid vertebral bodies (series 18, image 7) and there is a small fluid
collection with marginal enhancement within the anterior disc space
the (series 15, image 10 and series 18, image 10).

Conus medullaris: Extends to the L1 level and appears normal. No
abnormal enhancement. No epidural collection.

Paraspinal and other soft tissues: Mild edema and enhancement within
prevertebral soft tissues at the L5 and S1 levels. No paravertebral
fluid collection.

Disc levels:

L1-2 through L4-5: No significant disc displacement, foraminal
stenosis, or canal stenosis.

L5-S1: Disc bulge and marginal osteophytes eccentric to the left.
Mild right and moderate left foraminal stenosis. No significant
canal stenosis.
IMPRESSION: 1. Mild enhancement within the L5 and S1 vertebral bodies and small
marginally enhancing fluid collection within the anterior disc space
likely represents acute discitis or erosive osteoarthritis. Acute
inflammation is superimposed on extensive chronic inflammatory
changes which correspond to sclerosis and eburnation on prior CT.
2. Mild prevertebral edema at L5-S1 is likely reactive. No
paravertebral or epidural fluid collection identified.
3. Moderate disc bulge at L5-S1 eccentric to the left with moderate
left and mild right foraminal stenosis.
4. Otherwise no significant foraminal or canal stenosis of the
thoracic and lumbar spine.

By: Siisu Berrenstein M.D.
